# Patient Record
Sex: Male | Born: 1995 | Race: White | Hispanic: No | Marital: Married | Smoking: Never smoker
Health system: Southern US, Community
[De-identification: ages and names within clinical notes are randomized; demographics above are authoritative.]

## PROBLEM LIST (undated history)

## (undated) DIAGNOSIS — F419 Anxiety disorder, unspecified: Secondary | ICD-10-CM

## (undated) DIAGNOSIS — F32A Depression, unspecified: Secondary | ICD-10-CM

## (undated) DIAGNOSIS — Z789 Other specified health status: Secondary | ICD-10-CM

## (undated) HISTORY — PX: WISDOM TOOTH EXTRACTION: SHX21

---
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
## 2017-06-10 DIAGNOSIS — R519 Headache, unspecified: Secondary | ICD-10-CM | POA: Insufficient documentation

## 2017-08-19 ENCOUNTER — Ambulatory Visit: Payer: BLUE CROSS/BLUE SHIELD | Admitting: Podiatry

## 2017-08-19 ENCOUNTER — Encounter: Payer: Self-pay | Admitting: Podiatry

## 2017-08-19 ENCOUNTER — Ambulatory Visit (INDEPENDENT_AMBULATORY_CARE_PROVIDER_SITE_OTHER): Payer: BLUE CROSS/BLUE SHIELD

## 2017-08-19 VITALS — BP 117/67 | HR 57 | Resp 16

## 2017-08-19 DIAGNOSIS — M779 Enthesopathy, unspecified: Secondary | ICD-10-CM

## 2017-08-19 DIAGNOSIS — S93401A Sprain of unspecified ligament of right ankle, initial encounter: Secondary | ICD-10-CM

## 2017-08-19 NOTE — Progress Notes (Signed)
  Subjective:  Patient ID: Eric Strickland, male    DOB: [birth year],  MRN: [id number] HPI Chief Complaint  Patient presents with  . Foot Pain    Lateral foot and ankle right - rolled ankle playing dodgeball 2 years ago, has pain intermittently since injury, pain has now been more constant, pain AM and at end of day is worse, tried brace-no help  . New Patient (Initial Visit)    22 y.o. male presents with the above complaint.  He states that immediately after the injury from playing dodgeball 2-1/2 years ago he went to the orthopedic surgeon who put him in a brace.  The brace failed to improve his ability to ambulate without pain and was then sent to physical therapy for period of 6 weeks.  He states that it has lessened the initial symptoms but he still has pain every day on his job with ambulation.  He says it is affecting his ability to perform his daily activities. ROS: Denies fever chills nausea vomiting muscle aches pains calf pain back pain chest pain shortness of breath and headache.  No past medical history on file. No past surgical history on file.  Current Outpatient Medications:  .  nortriptyline (PAMELOR) 10 MG capsule, Take 10 mg by mouth at bedtime., Disp: , Rfl:   No Known Allergies Review of Systems Objective:   Vitals:   08/19/17 1556  BP: 117/67  Pulse: (!) 57  Resp: 16    General: Well developed, nourished, in no acute distress, alert and oriented x3   Dermatological: Skin is warm, dry and supple bilateral. Nails x 10 are well maintained; remaining integument appears unremarkable at this time. There are no open sores, no preulcerative lesions, no rash or signs of infection present.  Vascular: Dorsalis Pedis artery and Posterior Tibial artery pedal pulses are 2/4 bilateral with immedate capillary fill time. Pedal hair growth present. No varicosities and no lower extremity edema present bilateral.   Neruologic: Grossly intact via light touch bilateral. Vibratory  intact via tuning fork bilateral. Protective threshold with Semmes Wienstein monofilament intact to all pedal sites bilateral. Patellar and Achilles deep tendon reflexes 2+ bilateral. No Babinski or clonus noted bilateral.   Musculoskeletal: No gross boney pedal deformities bilateral. No pain, crepitus, or limitation noted with foot and ankle range of motion bilateral. Muscular strength 5/5 in all groups tested bilateral.  He has pain on palpation of the anterior talofibular ligament of the sinus tarsi and pain on end range of motion of the subtalar joint.  The mortise appears to be stable.  Mild tenderness on palpation of the calcaneofibular ligament.  Gait: Unassisted, Nonantalgic.    Radiographs:  Radiographs taken today do not demonstrate any major osseous abnormalities.  The subtalar joint appears to be slightly sclerotic possibly an injury within the subtalar joint itself has gone on to heal.  There is some joint space narrowing in this area and a small spur to the dorsal neck of the talus.  The mortise itself appears to be normal without any depressions.  Assessment & Plan:   Assessment: Moderate to severe ankle pain cannot rule out a tear of the anterior talofibular ligament or a subtalar joint injury.  Plan: At this point I started him on anti-inflammatory meloxicam 15 mg 1 p.o. daily and requesting an MRI for evaluation of a possible tear of the anterior talofibular ligament lateral ankle instability and subtalar joint pain.     Mayara Paulson T. PrestonHyatt, [place] Dakota[place]

## 2017-08-20 NOTE — Addendum Note (Signed)
Addended by: Geraldine ContrasVENABLE, Yuvraj Pfeifer D on: 08/20/2017 11:32 AM   Modules accepted: Orders

## 2017-08-28 ENCOUNTER — Telehealth: Payer: Self-pay | Admitting: *Deleted

## 2017-08-28 NOTE — Telephone Encounter (Signed)
"  Dr. Al CorpusHyatt has requested an appeal for a lower extremity MRI of right ankle for [facility].  I was calling to let you know we received the request.  We are re-opening the case today.  For the appeals process, we are requesting supporting clinical documentation to be faxed to us by Tuesday, June 18.  Our appeals fax number is [phone] and be sure to put attention appeals on your cover sheet.  If no additional information is sent, we will make a decision based on information already provided in the case.  If there are any questions, I can be reached at [phone] ext. [phone].  Things such as notes, images, and a letter of medical necessity (optional)  can be sent for appeals.

## 2017-08-31 ENCOUNTER — Telehealth: Payer: Self-pay | Admitting: *Deleted

## 2017-08-31 NOTE — Telephone Encounter (Signed)
Pt's grandmtr, states she received notice pt's insurance would not pay for the MRI, please call.

## 2017-08-31 NOTE — Telephone Encounter (Signed)
Grandmother was notified that we are waiting on peer to peer and once approved I will call her back and let her know.   She verbalized understanding.

## 2017-08-31 NOTE — Telephone Encounter (Signed)
Office note has been faxed to appeals for review.

## 2017-09-01 ENCOUNTER — Telehealth: Payer: Self-pay | Admitting: *Deleted

## 2017-09-01 NOTE — Telephone Encounter (Signed)
MRI has been approved   Auth # [id number]    Patient has been notified of approval and has MRI scheduled for 09/08/17 @5pm 

## 2017-09-01 NOTE — Telephone Encounter (Signed)
done

## 2017-09-01 NOTE — Telephone Encounter (Signed)
"  It's in regards to a provider's courtesy review that was done for this [employer] member, Eric Strickland.  Dr. Ernestene KielMax Hyatt is ordering a MRI of Ankle to be done at [facility].  The denial has been over turned.  Please note that receiving this is not a guarantee of payment.  Payment is subject to the member's active enrollment, benefits and limitations and other terms of the member's contract at the time services were provided.  The number for the MRI right ankle is [id number], it is valid for 30 days, June 17 through July 16.  That information will also be sent to the office.  If you have any questions, give me a call."

## 2017-09-10 ENCOUNTER — Ambulatory Visit
Admission: RE | Admit: 2017-09-10 | Discharge: 2017-09-10 | Disposition: A | Discharge status: Home / Self Care | Payer: BLUE CROSS/BLUE SHIELD | Source: Ambulatory Visit | Attending: Podiatry | Admitting: Podiatry

## 2017-09-10 DIAGNOSIS — M948X7 Other specified disorders of cartilage, ankle and foot: Secondary | ICD-10-CM | POA: Insufficient documentation

## 2017-09-10 DIAGNOSIS — S93401A Sprain of unspecified ligament of right ankle, initial encounter: Secondary | ICD-10-CM | POA: Diagnosis present

## 2017-09-22 ENCOUNTER — Telehealth: Payer: Self-pay | Admitting: *Deleted

## 2017-09-22 NOTE — Telephone Encounter (Signed)
-----   Message from Elinor ParkinsonMax T Hyatt, [place] sent at 09/16/2017  6:40 AM EDT ----- Sent for an over read and inform patient of the delay.  This will more than likely go to Dr. Logan BoresEvans.

## 2017-09-22 NOTE — Telephone Encounter (Signed)
I informed pt of Dr. Geryl RankinsHyatt's review of results and request to send copy of MRI disc to radiology specialist for more details for treatment planning. [facility].

## 2017-09-25 NOTE — Telephone Encounter (Signed)
Mailed copy of MRI disc to [facility]. 

## 2017-10-02 ENCOUNTER — Encounter: Payer: Self-pay | Admitting: Podiatry

## 2017-10-07 ENCOUNTER — Ambulatory Visit: Payer: BLUE CROSS/BLUE SHIELD | Admitting: Podiatry

## 2017-10-08 ENCOUNTER — Telehealth: Payer: Self-pay | Admitting: *Deleted

## 2017-10-08 NOTE — Telephone Encounter (Signed)
I was told that you guys have my MRI results. If you could please give me a call back at [phone]. Thank you.

## 2017-10-08 NOTE — Telephone Encounter (Signed)
Dr. Al CorpusHyatt reviewed the MRI over read and ordered for pt to be evaluated by Dr. Logan BoresEvans. Unable to leave a message voicemail box has not been set up on mobile phone. Left message on pt's home phone to call to discuss results of MRI.

## 2017-10-08 NOTE — Telephone Encounter (Signed)
I informed pt of Dr. Geryl RankinsHyatt's review of results and orders. Pt states understanding and I transferred to scheduler to be scheduled with Dr. Logan BoresEvans.

## 2017-10-19 ENCOUNTER — Ambulatory Visit: Payer: BLUE CROSS/BLUE SHIELD | Admitting: Podiatry

## 2017-10-23 ENCOUNTER — Encounter: Payer: Self-pay | Admitting: Podiatry

## 2017-10-23 ENCOUNTER — Ambulatory Visit: Payer: BLUE CROSS/BLUE SHIELD | Admitting: Podiatry

## 2017-10-23 DIAGNOSIS — M7671 Peroneal tendinitis, right leg: Secondary | ICD-10-CM

## 2017-10-23 MED ORDER — MELOXICAM 15 MG PO TABS: 15.0000 mg | ORAL_TABLET | Freq: Every day | ORAL | 1 refills | Status: AC

## 2017-10-23 MED ORDER — METHYLPREDNISOLONE 4 MG PO TBPK: ORAL_TABLET | ORAL | 0 refills | Status: DC

## 2017-10-26 NOTE — Progress Notes (Signed)
   HPI: 22 year old male presenting today for follow up evaluation of right foot pain. He states he has been feeling better overall but reports significant pain today. Walking and bearing weight increases the pain. Patient is here for further evaluation and treatment.   No past medical history on file.   Physical Exam: General: The patient is alert and oriented x3 in no acute distress.  Dermatology: Skin is warm, dry and supple bilateral lower extremities. Negative for open lesions or macerations.  Vascular: Palpable pedal pulses bilaterally. No edema or erythema noted. Capillary refill within normal limits.  Neurological: Epicritic and protective threshold grossly intact bilaterally.   Musculoskeletal Exam: Pain with palpation to the peroneal tendon of the right foot. Range of motion within normal limits to all pedal and ankle joints bilateral. Muscle strength 5/5 in all groups bilateral.   MRI Impression:  1. 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 2. High-grade partial-thickness cartilage loss of lateral aspect of the tibial plafond.  Assessment: 1. Peroneal tendinitis with partial tear right    Plan of Care:  1. Patient evaluated. MRI reviewed.  2. Injection of 0.5 mLs Celestone Soluspan injected into the peroneal tendon sheath of the right foot.  3. Prescription for Medrol Dose Pak provided to patient.  4. Prescription for Meloxicam provided to patient.  5. Return to clinic in 4 weeks.   Management consultantCommercial electrician.      Felecia ShellingBrent M. Evans, DPM [facility]  Dr. Felecia ShellingBrent M. Evans, DPM    [address]                                        [place], [place] [address]                Office [phone]  Fax [phone]

## 2017-11-27 ENCOUNTER — Ambulatory Visit: Payer: BLUE CROSS/BLUE SHIELD | Admitting: Podiatry

## 2017-12-15 ENCOUNTER — Ambulatory Visit: Payer: BLUE CROSS/BLUE SHIELD | Admitting: Podiatry

## 2018-01-12 ENCOUNTER — Encounter: Payer: Self-pay | Admitting: Podiatry

## 2018-01-12 ENCOUNTER — Ambulatory Visit: Payer: BLUE CROSS/BLUE SHIELD | Admitting: Podiatry

## 2018-01-12 DIAGNOSIS — M7671 Peroneal tendinitis, right leg: Secondary | ICD-10-CM | POA: Diagnosis not present

## 2018-01-12 MED ORDER — MELOXICAM 15 MG PO TABS: 15.0000 mg | ORAL_TABLET | Freq: Every day | ORAL | 1 refills | Status: AC

## 2018-01-13 NOTE — Progress Notes (Signed)
   HPI: 22 year old male presenting today for follow up evaluation of right foot pain. He states his pain has improved significantly. He denies daily pain but reports some intermittent mild discomfort. He denies any new complaints or modifying factors. Patient is here for further evaluation and treatment.   History reviewed. No pertinent past medical history.   Physical Exam: General: The patient is alert and oriented x3 in no acute distress.  Dermatology: Skin is warm, dry and supple bilateral lower extremities. Negative for open lesions or macerations.  Vascular: Palpable pedal pulses bilaterally. No edema or erythema noted. Capillary refill within normal limits.  Neurological: Epicritic and protective threshold grossly intact bilaterally.   Musculoskeletal Exam: Pain with palpation to the peroneal tendon of the right foot. Range of motion within normal limits to all pedal and ankle joints bilateral. Muscle strength 5/5 in all groups bilateral.   Assessment: 1. Peroneal tendinitis right    Plan of Care:  1. Patient evaluated. 2. Injection of 0.5 mLs Celestone Soluspan injected into the peroneal tendon sheath of the right foot.  3. Prescription for Meloxicam provided to patient.  4. Return to clinic as needed.   Management consultant.      Felecia Shelling, DPM [facility]  Dr. Felecia Shelling, DPM    [address] [place], [place] [address]                Office [phone]  Fax [phone]

## 2018-08-16 ENCOUNTER — Other Ambulatory Visit: Payer: Self-pay

## 2018-08-16 ENCOUNTER — Encounter: Payer: Self-pay | Admitting: *Deleted

## 2018-08-19 ENCOUNTER — Other Ambulatory Visit
Admission: RE | Admit: 2018-08-19 | Discharge: 2018-08-19 | Disposition: A | Discharge status: Home / Self Care | Payer: BLUE CROSS/BLUE SHIELD | Source: Ambulatory Visit | Attending: Unknown Physician Specialty | Admitting: Unknown Physician Specialty

## 2018-08-19 ENCOUNTER — Other Ambulatory Visit: Payer: Self-pay

## 2018-08-19 DIAGNOSIS — H65493 Other chronic nonsuppurative otitis media, bilateral: Secondary | ICD-10-CM | POA: Diagnosis not present

## 2018-08-19 DIAGNOSIS — E669 Obesity, unspecified: Secondary | ICD-10-CM | POA: Diagnosis not present

## 2018-08-19 DIAGNOSIS — J329 Chronic sinusitis, unspecified: Secondary | ICD-10-CM | POA: Diagnosis not present

## 2018-08-19 DIAGNOSIS — J343 Hypertrophy of nasal turbinates: Secondary | ICD-10-CM | POA: Diagnosis not present

## 2018-08-19 DIAGNOSIS — Z1159 Encounter for screening for other viral diseases: Secondary | ICD-10-CM | POA: Diagnosis not present

## 2018-08-19 DIAGNOSIS — Z6834 Body mass index (BMI) 34.0-34.9, adult: Secondary | ICD-10-CM | POA: Diagnosis not present

## 2018-08-20 ENCOUNTER — Ambulatory Visit
Admission: RE | Admit: 2018-08-20 | Payer: BLUE CROSS/BLUE SHIELD | Source: Home / Self Care | Admitting: Unknown Physician Specialty

## 2018-08-20 ENCOUNTER — Other Ambulatory Visit
Admission: RE | Admit: 2018-08-20 | Discharge: 2018-08-20 | Disposition: A | Discharge status: Home / Self Care | Payer: BLUE CROSS/BLUE SHIELD | Source: Ambulatory Visit | Attending: Unknown Physician Specialty | Admitting: Unknown Physician Specialty

## 2018-08-20 ENCOUNTER — Ambulatory Visit
Admission: RE | Admit: 2018-08-20 | Discharge: 2018-08-20 | Disposition: A | Discharge status: Home / Self Care | Payer: BLUE CROSS/BLUE SHIELD | Source: Ambulatory Visit | Attending: Unknown Physician Specialty | Admitting: Unknown Physician Specialty

## 2018-08-20 ENCOUNTER — Ambulatory Visit: Admission: RE | Admit: 2018-08-20 | Payer: BLUE CROSS/BLUE SHIELD | Source: Ambulatory Visit

## 2018-08-20 ENCOUNTER — Ambulatory Visit: Payer: BLUE CROSS/BLUE SHIELD | Admitting: Certified Registered"

## 2018-08-20 ENCOUNTER — Encounter
Admission: RE | Disposition: A | Discharge status: Home / Self Care | Payer: Self-pay | Source: Home / Self Care | Attending: Unknown Physician Specialty

## 2018-08-20 ENCOUNTER — Ambulatory Visit
Admission: RE | Admit: 2018-08-20 | Discharge: 2018-08-20 | Disposition: A | Discharge status: Home / Self Care | Payer: BLUE CROSS/BLUE SHIELD | Attending: Unknown Physician Specialty | Admitting: Unknown Physician Specialty

## 2018-08-20 ENCOUNTER — Encounter: Admission: RE | Payer: Self-pay | Source: Home / Self Care

## 2018-08-20 ENCOUNTER — Ambulatory Visit: Payer: BLUE CROSS/BLUE SHIELD | Admitting: Anesthesiology

## 2018-08-20 ENCOUNTER — Other Ambulatory Visit: Payer: Self-pay | Admitting: Unknown Physician Specialty

## 2018-08-20 DIAGNOSIS — M795 Residual foreign body in soft tissue: Secondary | ICD-10-CM

## 2018-08-20 DIAGNOSIS — J329 Chronic sinusitis, unspecified: Secondary | ICD-10-CM | POA: Insufficient documentation

## 2018-08-20 DIAGNOSIS — E669 Obesity, unspecified: Secondary | ICD-10-CM | POA: Insufficient documentation

## 2018-08-20 DIAGNOSIS — Z1159 Encounter for screening for other viral diseases: Secondary | ICD-10-CM | POA: Insufficient documentation

## 2018-08-20 DIAGNOSIS — H65493 Other chronic nonsuppurative otitis media, bilateral: Secondary | ICD-10-CM | POA: Insufficient documentation

## 2018-08-20 DIAGNOSIS — Z6834 Body mass index (BMI) 34.0-34.9, adult: Secondary | ICD-10-CM | POA: Insufficient documentation

## 2018-08-20 DIAGNOSIS — Z09 Encounter for follow-up examination after completed treatment for conditions other than malignant neoplasm: Secondary | ICD-10-CM

## 2018-08-20 DIAGNOSIS — J343 Hypertrophy of nasal turbinates: Secondary | ICD-10-CM | POA: Insufficient documentation

## 2018-08-20 HISTORY — PX: MYRINGOTOMY WITH TUBE PLACEMENT: SHX5663

## 2018-08-20 HISTORY — PX: ETHMOIDECTOMY: SHX5197

## 2018-08-20 HISTORY — PX: FRONTAL SINUS EXPLORATION: SHX6591

## 2018-08-20 HISTORY — PX: IMAGE GUIDED SINUS SURGERY: SHX6570

## 2018-08-20 HISTORY — PX: NASAL TURBINATE REDUCTION: SHX2072

## 2018-08-20 HISTORY — PX: NASAL ENDOSCOPY WITH EPISTAXIS CONTROL: SHX5664

## 2018-08-20 HISTORY — PX: MAXILLARY ANTROSTOMY: SHX2003

## 2018-08-20 HISTORY — DX: Other specified health status: Z78.9

## 2018-08-20 LAB — SARS CORONAVIRUS 2 BY RT PCR (HOSPITAL ORDER, PERFORMED IN ~~LOC~~ HOSPITAL LAB): SARS Coronavirus 2: NEGATIVE

## 2018-08-20 LAB — NOVEL CORONAVIRUS, NAA (HOSP ORDER, SEND-OUT TO REF LAB; TAT 18-24 HRS): SARS-CoV-2, NAA: NOT DETECTED

## 2018-08-20 SURGERY — SINUS SURGERY, WITH IMAGING GUIDANCE
Anesthesia: General

## 2018-08-20 SURGERY — CONTROL OF EPISTAXIS, ENDOSCOPIC
Anesthesia: General | Site: Nose

## 2018-08-20 SURGERY — SINUS SURGERY, WITH IMAGING GUIDANCE
Anesthesia: General | Site: Nose

## 2018-08-20 MED ORDER — OXYCODONE HCL 5 MG/5ML PO SOLN
5.0000 mg | Freq: Once | ORAL | Status: DC | PRN
  Administered 2018-08-20: 5 mg via ORAL

## 2018-08-20 MED ORDER — CLINDAMYCIN HCL 300 MG PO CAPS: 300.0000 mg | ORAL_CAPSULE | Freq: Three times a day (TID) | ORAL | 0 refills | Status: DC

## 2018-08-20 MED ORDER — LIDOCAINE HCL (CARDIAC) PF 100 MG/5ML IV SOSY
PREFILLED_SYRINGE | INTRAVENOUS | Status: DC | PRN
  Administered 2018-08-20: 40 mg via INTRAVENOUS

## 2018-08-20 MED ORDER — FENTANYL CITRATE (PF) 100 MCG/2ML IJ SOLN
25.0000 ug | INTRAMUSCULAR | Status: DC | PRN
  Administered 2018-08-20: 50 ug via INTRAVENOUS
  Administered 2018-08-20 (×2): 25 ug via INTRAVENOUS

## 2018-08-20 MED ORDER — GLYCOPYRROLATE 0.2 MG/ML IJ SOLN
INTRAMUSCULAR | Status: DC | PRN
  Administered 2018-08-20: 0.1 mg via INTRAVENOUS

## 2018-08-20 MED ORDER — PROPOFOL 10 MG/ML IV BOLUS
INTRAVENOUS | Status: DC | PRN
  Administered 2018-08-20: 200 mg via INTRAVENOUS

## 2018-08-20 MED ORDER — SUCCINYLCHOLINE CHLORIDE 20 MG/ML IJ SOLN
INTRAMUSCULAR | Status: DC | PRN
  Administered 2018-08-20: 100 mg via INTRAVENOUS

## 2018-08-20 MED ORDER — ACETAMINOPHEN 10 MG/ML IV SOLN
1000.0000 mg | Freq: Once | INTRAVENOUS | Status: AC
  Administered 2018-08-20: 1000 mg via INTRAVENOUS

## 2018-08-20 MED ORDER — DEXMEDETOMIDINE HCL 200 MCG/2ML IV SOLN
INTRAVENOUS | Status: DC | PRN
  Administered 2018-08-20 (×2): 10 ug via INTRAVENOUS
  Administered 2018-08-20: 5 ug via INTRAVENOUS

## 2018-08-20 MED ORDER — OXYCODONE HCL 5 MG PO TABS: 5.0000 mg | ORAL_TABLET | Freq: Once | ORAL | Status: DC | PRN

## 2018-08-20 MED ORDER — ACETAMINOPHEN 325 MG PO TABS: 325.0000 mg | ORAL_TABLET | ORAL | Status: DC | PRN

## 2018-08-20 MED ORDER — DEXAMETHASONE SODIUM PHOSPHATE 4 MG/ML IJ SOLN
INTRAMUSCULAR | Status: DC | PRN
  Administered 2018-08-20: 4 mg via INTRAVENOUS

## 2018-08-20 MED ORDER — ONDANSETRON HCL 4 MG/2ML IJ SOLN
INTRAMUSCULAR | Status: DC | PRN
  Administered 2018-08-20: 4 mg via INTRAVENOUS

## 2018-08-20 MED ORDER — PHENYLEPHRINE HCL 0.5 % NA SOLN
NASAL | Status: DC | PRN
  Administered 2018-08-20: 15 mL via TOPICAL

## 2018-08-20 MED ORDER — PROMETHAZINE HCL 25 MG/ML IJ SOLN: 6.2500 mg | INTRAMUSCULAR | Status: DC | PRN

## 2018-08-20 MED ORDER — LACTATED RINGERS IV SOLN
10.0000 mL/h | INTRAVENOUS | Status: DC
  Administered 2018-08-20: 10 mL/h via INTRAVENOUS

## 2018-08-20 MED ORDER — OXYCODONE-ACETAMINOPHEN 10-325 MG PO TABS: 1.0000 | ORAL_TABLET | Freq: Four times a day (QID) | ORAL | 0 refills | Status: AC | PRN

## 2018-08-20 MED ORDER — OXYMETAZOLINE HCL 0.05 % NA SOLN
6.0000 | Freq: Once | NASAL | Status: AC
  Administered 2018-08-20: 6 via NASAL

## 2018-08-20 MED ORDER — MIDAZOLAM HCL 5 MG/5ML IJ SOLN
INTRAMUSCULAR | Status: DC | PRN
  Administered 2018-08-20: 2 mg via INTRAVENOUS

## 2018-08-20 MED ORDER — CIPROFLOXACIN-DEXAMETHASONE 0.3-0.1 % OT SUSP
OTIC | Status: DC | PRN
  Administered 2018-08-20: 1 [drp] via OTIC

## 2018-08-20 MED ORDER — DEXAMETHASONE SODIUM PHOSPHATE 4 MG/ML IJ SOLN
INTRAMUSCULAR | Status: DC | PRN
  Administered 2018-08-20: 10 mg via INTRAVENOUS

## 2018-08-20 MED ORDER — LIDOCAINE-EPINEPHRINE 1 %-1:100000 IJ SOLN
INTRAMUSCULAR | Status: DC | PRN
  Administered 2018-08-20: 6 mL

## 2018-08-20 MED ORDER — ACETAMINOPHEN 160 MG/5ML PO SOLN: 325.0000 mg | ORAL | Status: DC | PRN

## 2018-08-20 MED ORDER — FENTANYL CITRATE (PF) 100 MCG/2ML IJ SOLN
INTRAMUSCULAR | Status: DC | PRN
  Administered 2018-08-20: 25 ug via INTRAVENOUS
  Administered 2018-08-20: 100 ug via INTRAVENOUS
  Administered 2018-08-20: 25 ug via INTRAVENOUS

## 2018-08-20 MED ORDER — LIDOCAINE HCL 4 % MT SOLN
OROMUCOSAL | Status: DC | PRN
  Administered 2018-08-20: 4 mL via TOPICAL

## 2018-08-20 SURGICAL SUPPLY — 37 items
BATTERY INSTRU NAVIGATION (MISCELLANEOUS) ×15 IMPLANT
BLADE MYR LANCE NRW W/HDL (BLADE) ×5 IMPLANT
CANISTER SUCT 1200ML W/VALVE (MISCELLANEOUS) ×5 IMPLANT
COAG SUCT 10F 3.5MM HAND CTRL (MISCELLANEOUS) ×5 IMPLANT
COTTONBALL LRG STERILE PKG (GAUZE/BANDAGES/DRESSINGS) ×5 IMPLANT
CUP MEDICINE 2OZ PLAST GRAD ST (MISCELLANEOUS) ×5 IMPLANT
DRAPE HEAD BAR (DRAPES) ×5 IMPLANT
DRESSING NASL FOAM PST OP SINU (MISCELLANEOUS) ×6 IMPLANT
DRSG NASAL FOAM POST OP SINU (MISCELLANEOUS) ×10
ELECT REM PT RETURN 9FT ADLT (ELECTROSURGICAL) ×5
ELECTRODE REM PT RTRN 9FT ADLT (ELECTROSURGICAL) ×3 IMPLANT
GLOVE BIO SURGEON STRL SZ7.5 (GLOVE) ×10 IMPLANT
HANDLE YANKAUER SUCT BULB TIP (MISCELLANEOUS) ×5 IMPLANT
KIT TURNOVER KIT A (KITS) ×5 IMPLANT
NDL HYPO 25GX1X1/2 BEV (NEEDLE) ×3 IMPLANT
NEEDLE HYPO 25GX1X1/2 BEV (NEEDLE) ×5 IMPLANT
NS IRRIG 500ML POUR BTL (IV SOLUTION) ×5 IMPLANT
PACK ENT CUSTOM (PACKS) ×5 IMPLANT
SOL ANTI-FOG 6CC FOG-OUT (MISCELLANEOUS) ×3 IMPLANT
SOL FOG-OUT ANTI-FOG 6CC (MISCELLANEOUS) ×2
SPONGE NEURO XRAY DETECT 1X3 (DISPOSABLE) ×5 IMPLANT
STRAP BODY AND KNEE 60X3 (MISCELLANEOUS) ×5 IMPLANT
SUT CHROMIC 3-0 (SUTURE) ×2
SUT CHROMIC 3-0 KS 27XMFL CR (SUTURE) ×3
SUT ETHILON 3-0 KS 30 BLK (SUTURE) ×5 IMPLANT
SUTURE CHRMC 3-0 KS 27XMFL CR (SUTURE) ×3 IMPLANT
SYR 10ML LL (SYRINGE) ×5 IMPLANT
TOWEL OR 17X26 4PK STRL BLUE (TOWEL DISPOSABLE) ×5 IMPLANT
TRACKER CRANIALMASK (MASK) ×5 IMPLANT
TRAP MUCOUS 40ML (MISCELLANEOUS) ×5 IMPLANT
TUBE EAR ARMSTRONG HC 1.14X3.5 (OTOLOGIC RELATED) ×10 IMPLANT
TUBING CONN 6MMX3.1M (TUBING) ×2
TUBING CONNECTING 10 (TUBING) ×4 IMPLANT
TUBING CONNECTING 10' (TUBING) ×1
TUBING SUCTION CONN 0.25 STRL (TUBING) ×3 IMPLANT
WATER STERILE IRR 250ML POUR (IV SOLUTION) ×5 IMPLANT
WATER STERILE IRR 500ML POUR (IV SOLUTION) ×5 IMPLANT

## 2018-08-20 SURGICAL SUPPLY — 16 items
CANISTER SUCT 1200ML W/VALVE (MISCELLANEOUS) ×3 IMPLANT
COAG SUCT 10F 3.5MM HAND CTRL (MISCELLANEOUS) ×2 IMPLANT
COTTONBALL LRG STERILE PKG (GAUZE/BANDAGES/DRESSINGS) IMPLANT
ELECT REM PT RETURN 9FT ADLT (ELECTROSURGICAL) ×3
ELECTRODE REM PT RTRN 9FT ADLT (ELECTROSURGICAL) IMPLANT
GLOVE BIO SURGEON STRL SZ7.5 (GLOVE) ×3 IMPLANT
HANDLE YANKAUER SUCT BULB TIP (MISCELLANEOUS) ×2 IMPLANT
PACK ENT CUSTOM (PACKS) ×2 IMPLANT
PACKING EPISTAXIS NASAL BLU LR (MISCELLANEOUS) ×4 IMPLANT
SPONGE NEURO XRAY DETECT 1X3 (DISPOSABLE) ×2 IMPLANT
STRAP BODY AND KNEE 60X3 (MISCELLANEOUS) ×3 IMPLANT
TOWEL OR 17X26 4PK STRL BLUE (TOWEL DISPOSABLE) ×3 IMPLANT
TUBING CONN 6MMX3.1M (TUBING) ×2
TUBING CONNECTING 10 (TUBING) ×1 IMPLANT
TUBING CONNECTING 10' (TUBING) ×1
TUBING SUCTION CONN 0.25 STRL (TUBING) ×1 IMPLANT

## 2018-08-20 NOTE — Op Note (Signed)
08/20/2018  11:04 AM    Eric Strickland, Eric Strickland  [id number]   Pre-Op Dx: Chronic Sinusitis  Hypertrophy of nasal turbinates Chronic Otitis Media  Post-op Dx: SAME  Proc: #1 Stryker navigation system used #2 bilateral endoscopic anterior and posterior ethmoid ectomy with removal of tissue #3 bilateral endoscopic maxillary antrostomy with removal of tissue #4 bilateral endoscopic frontal sinusotomy with removal of tissue #5 bilateral myringotomy and tube placement #6 bilateral submucous resection of the inferior turbinates  Surg:  Eric Strickland  Anes:  GOT  EBL: Approximately 30 cc  Comp: None  Findings: Bilateral middle ear effusions 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  After general endotracheal anesthesia the operating microscope was brought in the field.  Beginning on the right-hand side the ear was cleaned of cerumen.  Inferior myringotomy was performed there was mucoid liquid in the middle ear space which was suctioned free.  Armstrong grommet was placed followed by Ciprodex.  In similar fashion the left ear was examined an inferior myringotomy was performed there was mucoid effusion in the middle ear space which again was suctioned free and Armstrong grommet was placed followed by Ciprodex.  This completed the table was turned 90 degrees the Stryker navigation device was placed calibrated and remained on throughout the procedure.  The patient was then draped in usual fashion for endoscopic sinus surgery.  Cottonoid pledgets with phenylephrine lidocaine solution were placed within each nostril.  After approximate 5 minutes these were removed.  Local anesthetic of 1% lidocaine with 1 100,000 units of epinephrine was used to inject the inferior turbinates the middle turbinates and the lateral nasal walls bilaterally.  A  total of 7 cc was used.  The 0 degree endoscope was then brought into the field getting on the left-hand side the nasal passage was examined.  The middle turbinate was gently medialized there was mucopurulent debris which was seen coming from the ethmoid sinus this was sent for culture.  Using the Stryker navigation and visualization the uncinate process on the left was identified.  The caudal elevator was then used to take down the uncinate process which was removed using straight and side-biting forceps.  Maxillary sinus was opened there was polypoid mucosa as well as pus emanating from the sinus.  This pus was sent for culture.  The polypoid disease was removed and the antrostomy was opened widely.  Ethmoid bulla was then identified the anterior and posterior ethmoid air cells were then entered using the Stryker navigation suction and the straight and 45 degree forceps were then used to clean the ethmoid cavity.  Frontal sinus was then traced upward and entered using the 45 degree forceps.  Again there was polypoid mucosa throughout.  The left sinus was cleaned and  a cottonoid pledget was placed in the left and right side was examined.  The middle turbinate was gently medialized the uncinate process was identified and taken down using the caudal elevator.  A curved suction was then placed into the sinus there was polypoid disease throughout the sinus which was also removed.  Straight and side-biting forceps were then used to open this antrostomy widely.  Ethmoid bulla on the right was identified and using the Stryker navigator the anterior and posterior ethmoid air cells were probed.  There was thickened polypoid disease throughout which was removed using the straight 45 degree  forceps.  There is actually mucopurulence draining from the frontal sinus this was traced superiorly up into the sinus and the opening was cleared and opened widely.  There were several small bony fragments in the ethmoid cavity which  were gently removed.  Cottonoid pledget was placed on the right the left side was reexamined pledget removed any small bony fragments are polypoid disease in the ethmoid and maxillary frontal cavities were removed.  Similar fashion the pledget was removed on the right small bony fragments in the ethmoid cavity were removed the ethmoid maxillary frontal sinuses were patent.  Operation then turned the submucous section inferior turbinates.  Beginning on the left-hand side a 15 blade was used to incise along the inferior edge of the inferior turbinate down to the chondral bone and superiorly laterally based flap was then elevated.  Night scissors were then used to excise the uncinate bone and underlying mucosa.  The superior laterally based flap was then laid back over the chondral stump this was cauterized using suction cautery for hemostasis.  Similar fashion submucous resection was performed of the inferior turbinate on the right.  With completion of the SMR of the turbinates reinspection with the endoscope showed no evidence of significant bleeding.  Stambarger gel was then placed in both ethmoid cavities as well as bullae thick beneath each inferior turbinate.  The patient was then returned to anesthesia where he was extubated in the operating room taken care of him in stable condition.  Specimen: Sinus contents  Cultures: Left ethmoid and left maxillary sinuses    Dispo:   Good  Plan: Charged home follow-up 1 week  Eric Strickland  08/20/2018 11:04 AM

## 2018-08-20 NOTE — Anesthesia Postprocedure Evaluation (Addendum)
Anesthesia Post Note  Patient: Eric Strickland  Procedure(s) Performed: IMAGE GUIDED SINUS SURGERY (N/A Nose) MAXILLARY ANTROSTOMY (Bilateral Nose) ETHMOIDECTOMY (Bilateral Nose) FRONTAL SINUS EXPLORATION (Bilateral Nose) TURBINATE REDUCTION/SUBMUCOSAL RESECTION (Bilateral Nose) MYRINGOTOMY WITH TUBE PLACEMENT (Bilateral Ear)  Patient location during evaluation: PACU Anesthesia Type: General Level of consciousness: awake Pain management: pain level controlled Vital Signs Assessment: post-procedure vital signs reviewed and stable Respiratory status: respiratory function stable Cardiovascular status: stable Postop Assessment: no signs of nausea or vomiting Anesthetic complications: no Comments: Taking patient back to OR emergently for persistent bleeding.    Jola Babinski

## 2018-08-20 NOTE — Anesthesia Postprocedure Evaluation (Signed)
Anesthesia Post Note  Patient: Eric Strickland  Procedure(s) Performed: NASAL ENDOSCOPY WITH EPISTAXIS CONTROL (N/A Nose)  Patient location during evaluation: PACU Anesthesia Type: General Level of consciousness: awake Pain management: pain level controlled Vital Signs Assessment: post-procedure vital signs reviewed and stable Respiratory status: respiratory function stable Cardiovascular status: stable Postop Assessment: no signs of nausea or vomiting Anesthetic complications: no    Jola Babinski

## 2018-08-20 NOTE — Transfer of Care (Signed)
Immediate Anesthesia Transfer of Care Note  Patient: Eric Strickland  Procedure(s) Performed: NASAL ENDOSCOPY WITH EPISTAXIS CONTROL (N/A Nose)  Patient Location: PACU  Anesthesia Type: General  Level of Consciousness: awake, alert  and patient cooperative  Airway and Oxygen Therapy: Patient Spontanous Breathing and Patient connected to supplemental oxygen  Post-op Assessment: Post-op Vital signs reviewed, Patient's Cardiovascular Status Stable, Respiratory Function Stable, Patent Airway and No signs of Nausea or vomiting  Post-op Vital Signs: Reviewed and stable  Complications: No apparent anesthesia complications

## 2018-08-20 NOTE — Discharge Instructions (Signed)
[facility] °[facility] °ENDOSCOPIC SINUS SURGERY °[facility] ° °What is Functional Endoscopic Sinus Surgery? ° The Surgery involves making the natural openings of the sinuses larger by removing the bony partitions that separate the sinuses from the nasal cavity.  The natural sinus lining is preserved as much as possible to allow the sinuses to resume normal function after the surgery.  In some patients nasal polyps (excessively swollen lining of the sinuses) may be removed to relieve obstruction of the sinus openings.  The surgery is performed through the nose using lighted scopes, which eliminates the need for incisions on the face.  A septoplasty is a different procedure which is sometimes performed with sinus surgery.  It involves straightening the boy partition that separates the two sides of your nose.  A crooked or deviated septum may need repair if is obstructing the sinuses or nasal airflow.  Turbinate reduction is also often performed during sinus surgery.  The turbinates are bony proturberances from the side walls of the nose which swell and can obstruct the nose in patients with sinus and allergy problems.  Their size can be surgically reduced to help relieve nasal obstruction. ° °What Can Sinus Surgery Do For Me? ° Sinus surgery can reduce the frequency of sinus infections requiring antibiotic treatment.  This can provide improvement in nasal congestion, post-nasal drainage, facial pressure and nasal obstruction.  Surgery will NOT prevent you from ever having an infection again, so it usually only for patients who get infections 4 or more times yearly requiring antibiotics, or for infections that do not clear with antibiotics.  It will not cure nasal allergies, so patients with allergies may still require medication to treat their allergies after surgery. Surgery may improve headaches related to sinusitis, however, some people will continue to  require medication to control sinus headaches related to allergies.  Surgery will do nothing for other forms of headache (migraine, tension or cluster). ° °What Are the Risks of Endoscopic Sinus Surgery? ° Current techniques allow surgery to be performed safely with little risk, however, there are rare complications that patients should be aware of.  Because the sinuses are located around the eyes, there is risk of eye injury, including blindness, though again, this would be quite rare. This is usually a result of bleeding behind the eye during surgery, which puts the vision oat risk, though there are treatments to protect the vision and prevent permanent disrupted by surgery causing a leak of the spinal fluid that surrounds the brain.  More serious complications would include bleeding inside the brain cavity or damage to the brain.  Again, all of these complications are uncommon, and spinal fluid leaks can be safely managed surgically if they occur.  The most common complication of sinus surgery is bleeding from the nose, which may require packing or cauterization of the nose.  Continued sinus have polyps may experience recurrence of the polyps requiring revision surgery.  Alterations of sense of smell or injury to the tear ducts are also rare complications.  ° °What is the Surgery Like, and what is the Recovery? ° The Surgery usually takes a couple of hours to perform, and is usually performed under a general anesthetic (completely asleep).  Patients are usually discharged home after a couple of hours.  Sometimes during surgery it is necessary to pack the nose to control bleeding, and the packing is left in place for 24 - 48 hours, and removed by your surgeon.    If a septoplasty was performed during the procedure, there is often a splint placed which must be removed after 5-7 days.   °Discomfort: Pain is usually mild to moderate, and can be controlled by prescription pain medication or acetaminophen (Tylenol).   Aspirin, Ibuprofen (Advil, Motrin), or Naprosyn (Aleve) should be avoided, as they can cause increased bleeding.  Most patients feel sinus pressure like they have a bad head cold for several days.  Sleeping with your head elevated can help reduce swelling and facial pressure, as can ice packs over the face.  A humidifier may be helpful to keep the mucous and blood from drying in the nose.  ° °Diet: There are no specific diet restrictions, however, you should generally start with clear liquids and a light diet of bland foods because the anesthetic can cause some nausea.  Advance your diet depending on how your stomach feels.  Taking your pain medication with food will often help reduce stomach upset which pain medications can cause. ° °Nasal Saline Irrigation: It is important to remove blood clots and dried mucous from the nose as it is healing.  This is done by having you irrigate the nose at least 3 - 4 times daily with a salt water solution.  We recommend using NeilMed Sinus Rinse (available at the drug store).  Fill the squeeze bottle with the solution, bend over a sink, and insert the tip of the squeeze bottle into the nose ½ of an inch.  Point the tip of the squeeze bottle towards the inside corner of the eye on the same side your irrigating.  Squeeze the bottle and gently irrigate the nose.  If you bend forward as you do this, most of the fluid will flow back out of the nose, instead of down your throat.   The solution should be warm, near body temperature, when you irrigate.   Each time you irrigate, you should use a full squeeze bottle.  ° °Note that if you are instructed to use Nasal Steroid Sprays at any time after your surgery, irrigate with saline BEFORE using the steroid spray, so you do not wash it all out of the nose. °Another product, Nasal Saline Gel (such as AYR Nasal Saline Gel) can be applied in each nostril 3 - 4 times daily to moisture the nose and reduce scabbing or crusting. ° °Bleeding:   Bloody drainage from the nose can be expected for several days, and patients are instructed to irrigate their nose frequently with salt water to help remove mucous and blood clots.  The drainage may be dark red or brown, though some fresh blood may be seen intermittently, especially after irrigation.  Do not blow you nose, as bleeding may occur. If you must sneeze, keep your mouth open to allow air to escape through your mouth. ° °If heavy bleeding occurs: Irrigate the nose with saline to rinse out clots, then spray the nose 3 - 4 times with Afrin Nasal Decongestant Spray.  The spray will constrict the blood vessels to slow bleeding.  Pinch the lower half of your nose shut to apply pressure, and lay down with your head elevated.  Ice packs over the nose may help as well. If bleeding persists despite these measures, you should notify your doctor.  Do not use the Afrin routinely to control nasal congestion after surgery, as it can result in worsening congestion and may affect healing.  ° ° ° °Activity: Return to work varies among patients. Most patients will be   out of work at least 5 - 7 days to recover.  Patient may return to work after they are off of narcotic pain medication, and feeling well enough to perform the functions of their job.  Patients must avoid heavy lifting (over 10 pounds) or strenuous physical for 2 weeks after surgery, so your employer may need to assign you to light duty, or keep you out of work longer if light duty is not possible.  NOTE: you should not drive, operate dangerous machinery, do any mentally demanding tasks or make any important legal or financial decisions while on narcotic pain medication and recovering from the general anesthetic.    Call Your Doctor Immediately if You Have Any of the Following: 1. Bleeding that you cannot control with the above measures 2. Loss of vision, double vision, bulging of the eye or black eyes. 3. Fever over 101 degrees 4. Neck stiffness with  severe headache, fever, nausea and change in mental state. You are always encourage to call anytime with concerns, however, please call with requests for pain medication refills during office hours.  Office Endoscopy: During follow-up visits your doctor will remove any packing or splints that may have been placed and evaluate and clean your sinuses endoscopically.  Topical anesthetic will be used to make this as comfortable as possible, though you may want to take your pain medication prior to the visit.  How often this will need to be done varies from patient to patient.  After complete recovery from the surgery, you may need follow-up endoscopy from time to time, particularly if there is concern of recurrent infection or nasal polyps.  [facility] DISCHARGE INSTRUCTIONS FOR MYRINGOTOMY AND TUBE INSERTION  [facility] Vernie Murders, M.D. Davina Poke, M.D. Marion Downer, M.D. Bud Face, M.D.  Diet:   After surgery, the patient should take only liquids and foods as tolerated.  The patient may then have a regular diet after the effects of anesthesia have worn off, usually about four to six hours after surgery.  Activities:   The patient should rest until the effects of anesthesia have worn off.  After this, there are no restrictions on the normal daily activities.  Medications:   You will be given antibiotic drops to be used in the ears postoperatively.  It is recommended to use 4 drops 2 times a day for 4 days, then the drops should be saved for possible future use.  The tubes should not cause any discomfort to the patient, but if there is any question, Tylenol should be given according to the instructions for the age of the patient.  Other medications should be continued normally.  Precautions:   Should there be recurrent drainage after the tubes are placed, the drops should be used for approximately 3-4 days.  If it does not clear, you should  call the ENT office.  Earplugs:   Earplugs are only needed for those who are going to be submerged under water.  When taking a bath or shower and using a cup or showerhead to rinse hair, it is not necessary to wear earplugs.  These come in a variety of fashions, all of which can be obtained at our office.  However, if one is not able to come by the office, then silicone plugs can be found at most pharmacies.  It is not advised to stick anything in the ear that is not approved as an earplug.  Silly putty is not to be  used as an Engineer, civil (consulting).  Swimming is allowed in patients after ear tubes are inserted, however, they must wear earplugs if they are going to be submerged under water.  For those children who are going to be swimming a lot, it is recommended to use a fitted ear mold, which can be made by our audiologist.  If discharge is noticed from the ears, this most likely represents an ear infection.  We would recommend getting your eardrops and using them as indicated above.  If it does not clear, then you should call the ENT office.  For follow up, the patient should return to the ENT office three weeks postoperatively and then every six months as required by the doctor.   General Anesthesia, Adult, Care After This sheet gives you information about how to care for yourself after your procedure. Your health care provider may also give you more specific instructions. If you have problems or questions, contact your health care provider. What can I expect after the procedure? After the procedure, the following side effects are common:  Pain or discomfort at the IV site.  Nausea.  Vomiting.  Sore throat.  Trouble concentrating.  Feeling cold or chills.  Weak or tired.  Sleepiness and fatigue.  Soreness and body aches. These side effects can affect parts of the body that were not involved in surgery. Follow these instructions at home:  For at least 24 hours after the procedure:  Have a  responsible adult stay with you. It is important to have someone help care for you until you are awake and alert.  Rest as needed.  Do not: ? Participate in activities in which you could fall or become injured. ? Drive. ? Use heavy machinery. ? Drink alcohol. ? Take sleeping pills or medicines that cause drowsiness. ? Make important decisions or sign legal documents. ? Take care of children on your own. Eating and drinking  Follow any instructions from your health care provider about eating or drinking restrictions.  When you feel hungry, start by eating small amounts of foods that are soft and easy to digest (bland), such as toast. Gradually return to your regular diet.  Drink enough fluid to keep your urine pale yellow.  If you vomit, rehydrate by drinking water, juice, or clear broth. General instructions  If you have sleep apnea, surgery and certain medicines can increase your risk for breathing problems. Follow instructions from your health care provider about wearing your sleep device: ? Anytime you are sleeping, including during daytime naps. ? While taking prescription pain medicines, sleeping medicines, or medicines that make you drowsy.  Return to your normal activities as told by your health care provider. Ask your health care provider what activities are safe for you.  Take over-the-counter and prescription medicines only as told by your health care provider.  If you smoke, do not smoke without supervision.  Keep all follow-up visits as told by your health care provider. This is important. Contact a health care provider if:  You have nausea or vomiting that does not get better with medicine.  You cannot eat or drink without vomiting.  You have pain that does not get better with medicine.  You are unable to pass urine.  You develop a skin rash.  You have a fever.  You have redness around your IV site that gets worse. Get help right away if:  You have  difficulty breathing.  You have chest pain.  You have blood in your urine or  stool, or you vomit blood. Summary  After the procedure, it is common to have a sore throat or nausea. It is also common to feel tired.  Have a responsible adult stay with you for the first 24 hours after general anesthesia. It is important to have someone help care for you until you are awake and alert.  When you feel hungry, start by eating small amounts of foods that are soft and easy to digest (bland), such as toast. Gradually return to your regular diet.  Drink enough fluid to keep your urine pale yellow.  Return to your normal activities as told by your health care provider. Ask your health care provider what activities are safe for you. This information is not intended to replace advice given to you by your health care provider. Make sure you discuss any questions you have with your health care provider. Document Released: 06/09/2000 Document Revised: 10/17/2016 Document Reviewed: 10/17/2016 Elsevier Interactive Patient Education  2019 [employer]

## 2018-08-20 NOTE — H&P (Signed)
The patient's history has been reviewed, patient examined, no change in status, stable for surgery.  Questions were answered to the patients satisfaction.  

## 2018-08-20 NOTE — Anesthesia Procedure Notes (Signed)
Procedure Name: Intubation Date/Time: 08/20/2018 9:42 AM Performed by: Jimmy Picket, CRNA Pre-anesthesia Checklist: Patient identified, Emergency Drugs available, Suction available, Patient being monitored and Timeout performed Patient Re-evaluated:Patient Re-evaluated prior to induction Oxygen Delivery Method: Circle system utilized Preoxygenation: Pre-oxygenation with 100% oxygen Induction Type: IV induction Ventilation: Mask ventilation without difficulty Laryngoscope Size: Miller and 3 Grade View: Grade I Tube type: Oral Rae Tube size: 8.0 mm Number of attempts: 1 Placement Confirmation: ETT inserted through vocal cords under direct vision,  positive ETCO2 and breath sounds checked- equal and bilateral Tube secured with: Tape Dental Injury: Teeth and Oropharynx as per pre-operative assessment

## 2018-08-20 NOTE — Anesthesia Preprocedure Evaluation (Signed)
Anesthesia Evaluation  Patient identified by MRN, date of birth, ID band Patient awake    Reviewed: Allergy & Precautions, NPO status , Patient's Chart, lab work & pertinent test results  Airway Mallampati: II  TM Distance: >3 FB     Dental   Pulmonary neg pulmonary ROS,    breath sounds clear to auscultation       Cardiovascular negative cardio ROS   Rhythm:Regular Rate:Normal     Neuro/Psych negative neurological ROS     GI/Hepatic negative GI ROS,   Endo/Other  Obesity - BMI 34  Renal/GU      Musculoskeletal   Abdominal   Peds  Hematology   Anesthesia Other Findings Chronic sinusitis and otitis media  Reproductive/Obstetrics                             Anesthesia Physical Anesthesia Plan  ASA: II  Anesthesia Plan: General   Post-op Pain Management:    Induction: Intravenous  PONV Risk Score and Plan: 2 and Ondansetron and Dexamethasone  Airway Management Planned: Oral ETT  Additional Equipment:   Intra-op Plan:   Post-operative Plan:   Informed Consent: I have reviewed the patients History and Physical, chart, labs and discussed the procedure including the risks, benefits and alternatives for the proposed anesthesia with the patient or authorized representative who has indicated his/her understanding and acceptance.     Dental advisory given  Plan Discussed with: CRNA  Anesthesia Plan Comments:         Anesthesia Quick Evaluation

## 2018-08-20 NOTE — Anesthesia Preprocedure Evaluation (Signed)
Anesthesia Evaluation  Patient identified by MRN, date of birth, ID band Patient awake    Reviewed: Allergy & Precautions, NPO status , Patient's Chart, lab work & pertinent test results  Airway Mallampati: II  TM Distance: >3 FB     Dental   Pulmonary neg pulmonary ROS,    breath sounds clear to auscultation       Cardiovascular negative cardio ROS   Rhythm:Regular Rate:Normal     Neuro/Psych negative neurological ROS     GI/Hepatic negative GI ROS,   Endo/Other  Obesity - BMI 34  Renal/GU      Musculoskeletal   Abdominal   Peds  Hematology   Anesthesia Other Findings Chronic sinusitis and otitis media  Reproductive/Obstetrics                             Anesthesia Physical  Anesthesia Plan  ASA: II and emergent  Anesthesia Plan: General   Post-op Pain Management:    Induction: Intravenous  PONV Risk Score and Plan: 2 and Ondansetron and Dexamethasone  Airway Management Planned: Oral ETT  Additional Equipment:   Intra-op Plan:   Post-operative Plan:   Informed Consent: I have reviewed the patients History and Physical, chart, labs and discussed the procedure including the risks, benefits and alternatives for the proposed anesthesia with the patient or authorized representative who has indicated his/her understanding and acceptance.     Dental advisory given  Plan Discussed with: CRNA  Anesthesia Plan Comments: (Back to OR emergently for bleeding - mother aware. )        Anesthesia Quick Evaluation

## 2018-08-20 NOTE — Progress Notes (Addendum)
Per MD Juengel, patients facial xrays are clear and patient can be discharged. Per MD Juengel, no retained objects. Radiologist review pending.

## 2018-08-20 NOTE — Anesthesia Procedure Notes (Addendum)
Procedure Name: Intubation Date/Time: 08/20/2018 12:37 PM Performed by: Jimmy Picket, CRNA Pre-anesthesia Checklist: Patient identified, Emergency Drugs available, Suction available, Patient being monitored and Timeout performed Patient Re-evaluated:Patient Re-evaluated prior to induction Oxygen Delivery Method: Circle system utilized Preoxygenation: Pre-oxygenation with 100% oxygen Induction Type: IV induction, Rapid sequence and Cricoid Pressure applied Ventilation: Mask ventilation without difficulty Laryngoscope Size: Miller and 3 Grade View: Grade I Tube type: Oral Rae Tube size: 8.5 mm Number of attempts: 1 Airway Equipment and Method: Bougie stylet Placement Confirmation: ETT inserted through vocal cords under direct vision,  positive ETCO2 and breath sounds checked- equal and bilateral Tube secured with: Tape Dental Injury: Teeth and Oropharynx as per pre-operative assessment

## 2018-08-20 NOTE — Progress Notes (Signed)
MD Jenne Campus notified at 1151 that patient was bleeding heavily out of his nose. MD Jenne Campus gave verbal order to administer afrin to patient and call him back.  MD Jenne Campus notified at 1203 that bleeding was persistent. Stated he was on his way back and that patient would need to be taken to OR.  Mom notified of plan and verbalized understanding.

## 2018-08-20 NOTE — Op Note (Signed)
08/20/2018  1:05 PM    Leona Carry  [id number]   Pre-Op Dx: sinus bleeding, when waking up from the first procedure the patient was severely combative was taken to the recovery room initially seem to be recovering nicely but began to bleed from the nose.  It was felt that we need taken back to the operating room to discover the cause of the bleeding.  Discussed with mom she understands and is eager for [place] to proceed  Post-op Dx: SAME  Proc: Endoscopic control of nasal epistaxis  Surg:  Davina Poke  Anes:  GOT  EBL: 300 cc  Comp: 0  Findings: Obvious bleeding vessel posterior aspect inferior turbinate left  Procedure: And was taken from the recovery room back to the operating room.  He was reintubated.  The table is turned 90 degrees patient was draped in usual fashion for endoscopic sinus surgery.  Large clots within each nostril which were removed.  Cottonoid pledgets with phenylephrine lidocaine were placed within each nostril.  While the phenylephrine was working the endoscope was brought into the field.  The pledgets were first removed on the right-hand side inspection with a 0 degree endoscope showed no evidence of active bleeding just minimal ooze from the inferior turbinate there was no bleeding from the ethmoid cavity.  Pledgets were removed from the left-hand side there was obvious bleeding from this side.  The 0 degree endoscope was introduced into the nose and using the suction cautery to suction the blood there was obvious bleeder in the posterior aspect of the inferior turbinate.  This was cauterized using the suction cautery and immediately stop the bleeding.  The blood was suctioned free inspection of thel ethmoid cavity the inferior turbinate there was no further active bleeding.  Several cauterization moves were then re-performed in this area of the post/inf turbinate just to be sure there was no further bleeding indeed there was none.  Because of the way he woke up  before and blew all of the Stammberger gel out I felt that nasal packing would be most appropriate.  Therefore 2 Merocel sponges full-length were placed within each nostril tied anteriorly anterior to the columella to prevent aspiration.  Dispo:   Good  Plan:  Discharge to home return on Tuesday for pack removal.  Davina Poke  08/20/2018 1:05 PM

## 2018-08-20 NOTE — Transfer of Care (Signed)
Immediate Anesthesia Transfer of Care Note  Patient: Eric Strickland  Procedure(s) Performed: IMAGE GUIDED SINUS SURGERY (N/A Nose) MAXILLARY ANTROSTOMY (Bilateral Nose) ETHMOIDECTOMY (Bilateral Nose) FRONTAL SINUS EXPLORATION (Bilateral Nose) TURBINATE REDUCTION/SUBMUCOSAL RESECTION (Bilateral Nose) MYRINGOTOMY WITH TUBE PLACEMENT (Bilateral Ear)  Patient Location: PACU  Anesthesia Type: General  Level of Consciousness: awake, alert  and patient cooperative  Airway and Oxygen Therapy: Patient Spontanous Breathing and Patient connected to supplemental oxygen  Post-op Assessment: Post-op Vital signs reviewed, Patient's Cardiovascular Status Stable, Respiratory Function Stable, Patent Airway and No signs of Nausea or vomiting  Post-op Vital Signs: Reviewed and stable  Complications: No apparent anesthesia complications

## 2018-08-20 NOTE — OR Nursing (Signed)
Patient brought back to the OR for epistaxis control. No count obtained. Xray taken. No retained items noted.  No formal timeout performed. Patient armband correct and same surgeon and staff as previous case verified correct patient and procedure

## 2018-08-23 ENCOUNTER — Encounter: Payer: Self-pay | Admitting: Unknown Physician Specialty

## 2018-08-24 LAB — SURGICAL PATHOLOGY

## 2018-08-25 LAB — AEROBIC/ANAEROBIC CULTURE W GRAM STAIN (SURGICAL/DEEP WOUND): Gram Stain: NONE SEEN

## 2019-04-21 ENCOUNTER — Ambulatory Visit: Payer: Self-pay | Attending: Internal Medicine

## 2019-04-21 DIAGNOSIS — Z20822 Contact with and (suspected) exposure to covid-19: Secondary | ICD-10-CM | POA: Insufficient documentation

## 2019-04-22 LAB — NOVEL CORONAVIRUS, NAA: SARS-CoV-2, NAA: NOT DETECTED

## 2020-02-14 ENCOUNTER — Encounter: Payer: Self-pay | Admitting: Emergency Medicine

## 2020-02-14 ENCOUNTER — Emergency Department
Admission: EM | Admit: 2020-02-14 | Discharge: 2020-02-14 | Disposition: A | Discharge status: Home / Self Care | Payer: Self-pay | Attending: Emergency Medicine | Admitting: Emergency Medicine

## 2020-02-14 ENCOUNTER — Other Ambulatory Visit: Payer: Self-pay

## 2020-02-14 DIAGNOSIS — L299 Pruritus, unspecified: Secondary | ICD-10-CM | POA: Diagnosis not present

## 2020-02-14 DIAGNOSIS — Z5321 Procedure and treatment not carried out due to patient leaving prior to being seen by health care provider: Secondary | ICD-10-CM | POA: Insufficient documentation

## 2020-02-14 HISTORY — DX: Depression, unspecified: F32.A

## 2020-02-14 HISTORY — DX: Anxiety disorder, unspecified: F41.9

## 2020-02-14 NOTE — ED Triage Notes (Signed)
Pt to ED from home c/o itching that started this morning and worsening throughout the day.  States redness to entire body with itching and pain.  Denies SOB, swelling, or hives.  Denies new sheets, soaps, perfumes.  States started taking sertraline and bupropion 1 week ago.  Pt chest rise even and unlabored, speaking in complete and coherent sentences, skin red, in NAD at this time.

## 2020-02-14 NOTE — ED Notes (Signed)
No answer when called several times from lobby 

## 2020-05-02 IMAGING — MR MR ANKLE*R* W/O CM
5 series · 40 of 40 positions shown · non-contrast
Comparison: None.

CLINICAL DATA: Right ankle pain for 2.5 years.

EXAM:
MRI OF THE RIGHT ANKLE WITHOUT CONTRAST
TECHNIQUE: Multiplanar, multisequence MR imaging of the ankle was performed. No
intravenous contrast was administered.

[Series 4: PD fat-sat · axial · 3.0mm · 0.56mm/px · z∈[-83,+54]mm · 8 of 39 slices shown]
[im 1/39]
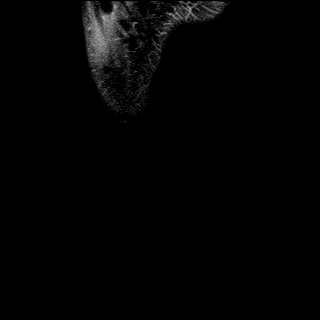
[im 6/39]
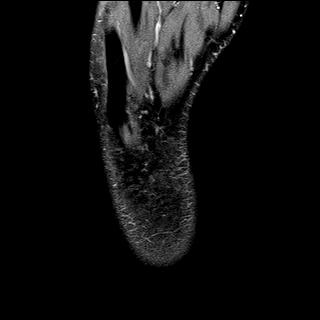
[im 11/39]
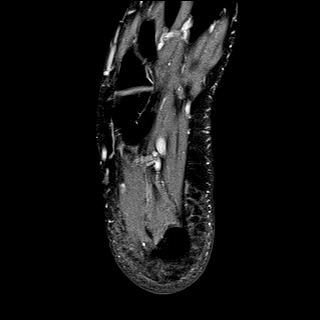
[im 17/39]
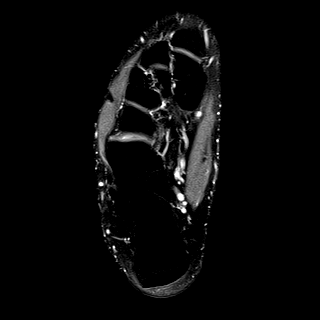
[im 22/39]
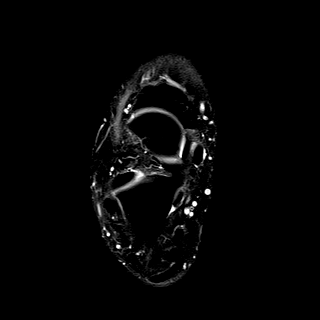
[im 28/39]
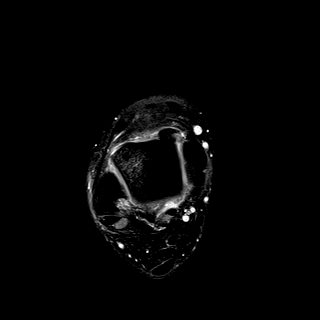
[im 33/39]
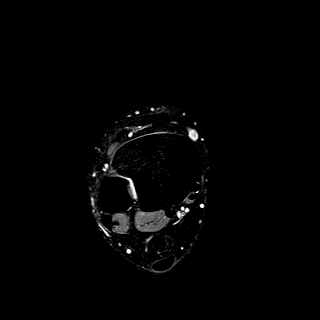
[im 39/39]
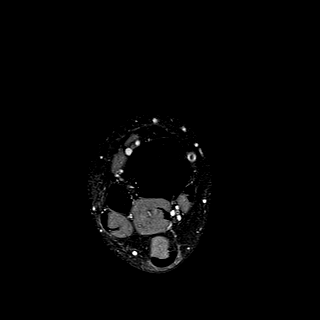

[Series 5: T2 fat-sat · axial · 3.0mm · 0.56mm/px · z∈[-83,+54]mm · 8 of 39 slices shown (1 of 2)]
[im 1/39]
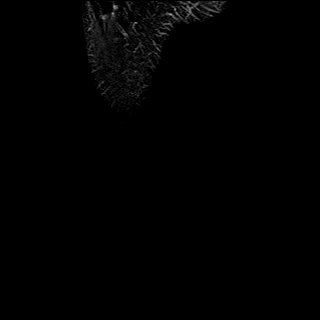
[im 6/39]
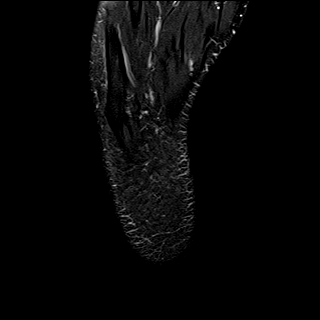
[im 11/39]
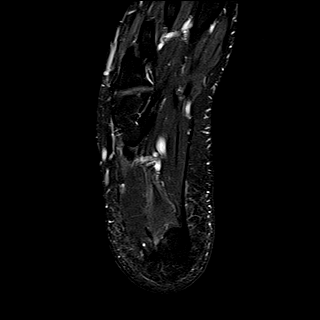
[im 17/39]
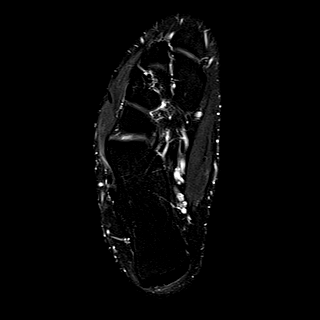
[im 22/39]
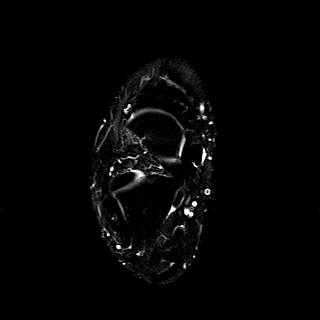
[im 28/39]
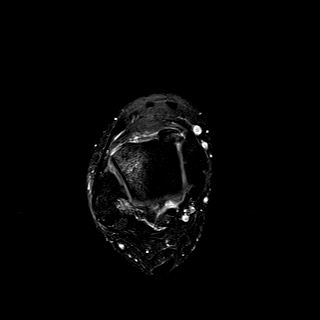
[im 33/39]
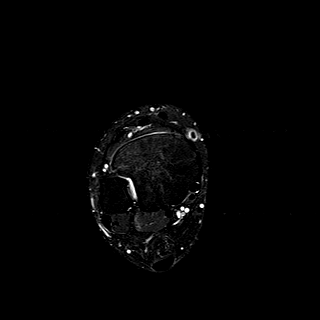
[im 39/39]
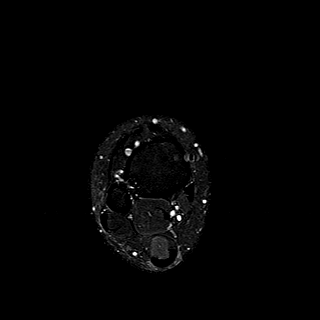

[Series 6: T2 fat-sat · coronal · 3.0mm · 0.70mm/px · 10 of 47 slices shown (2 of 2)]
[im 1/47]
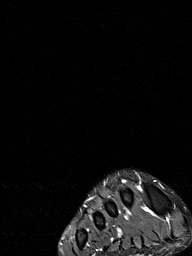
[im 6/47]
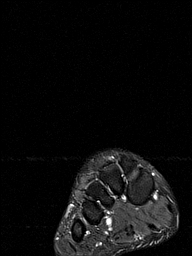
[im 11/47]
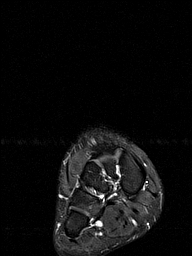
[im 16/47]
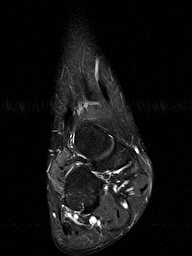
[im 21/47]
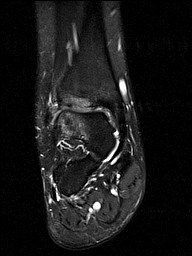
[im 26/47]
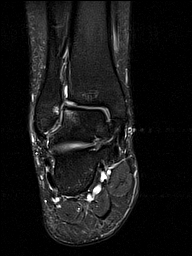
[im 31/47]
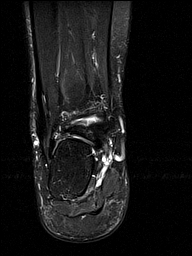
[im 36/47]
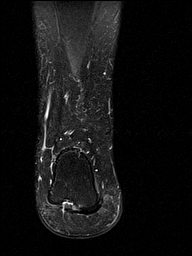
[im 41/47]
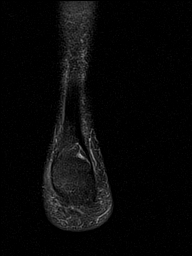
[im 47/47]
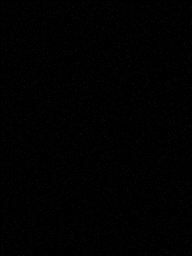

[Series 7: T1 · sagittal · 3.0mm · 0.56mm/px · 7 of 31 slices shown]
[im 1/31]
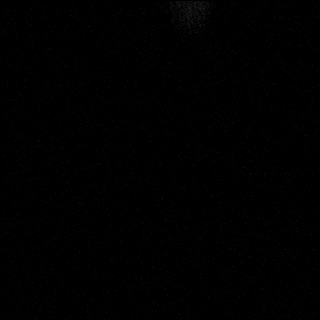
[im 6/31]
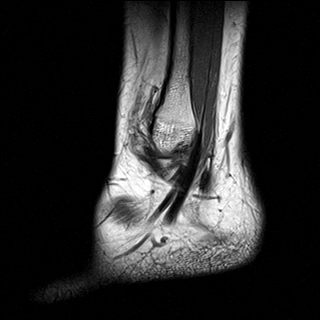
[im 11/31]
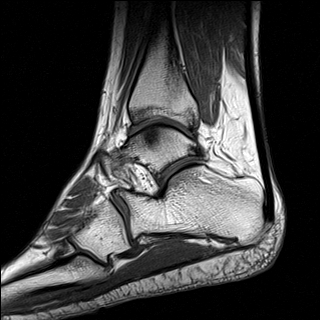
[im 16/31]
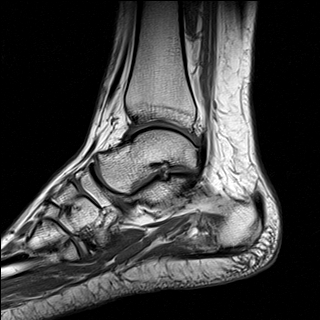
[im 21/31]
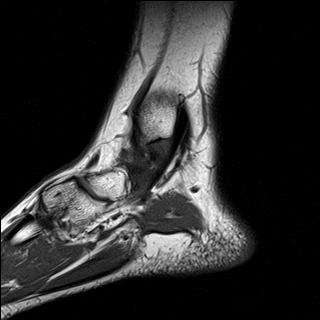
[im 26/31]
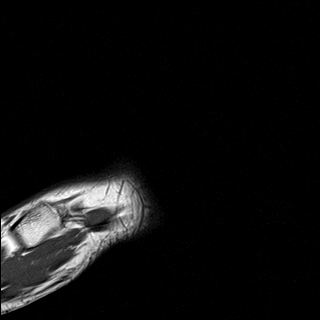
[im 31/31]
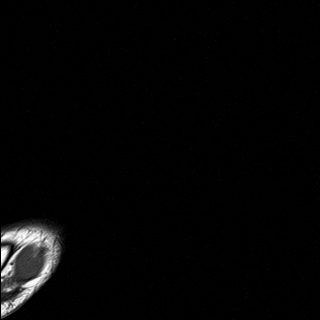

[Series 8: STIR · sagittal · 3.0mm · 0.35mm/px · 7 of 31 slices shown]
[im 1/31]
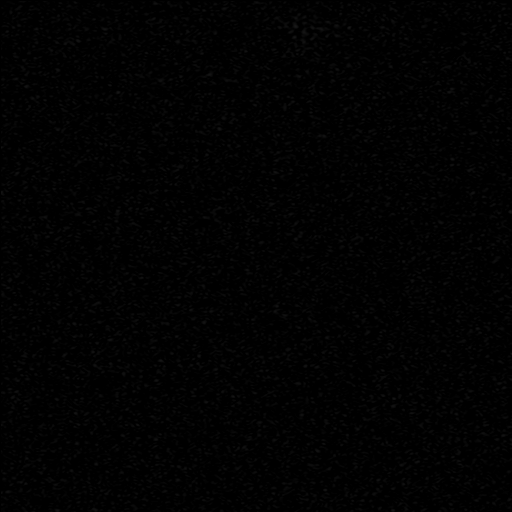
[im 6/31]
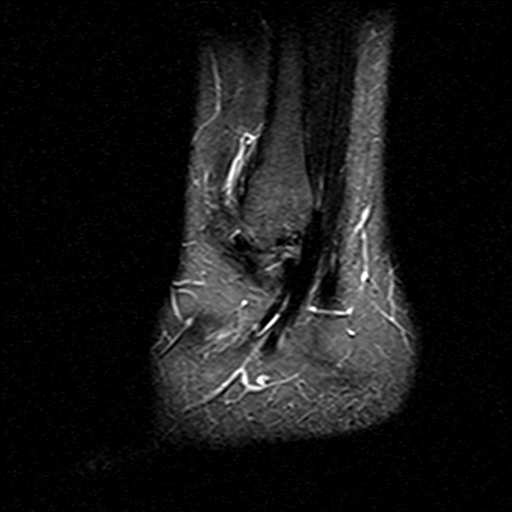
[im 11/31]
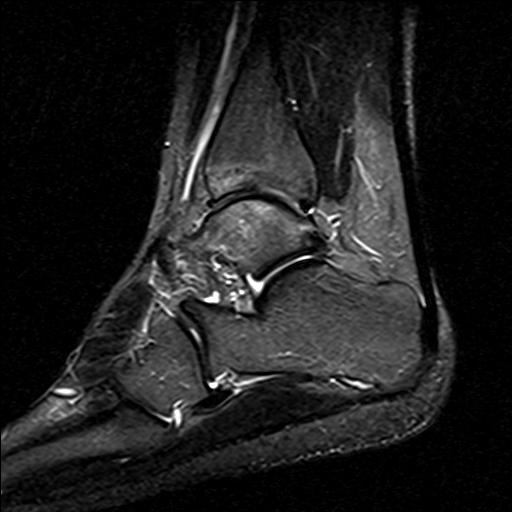
[im 16/31]
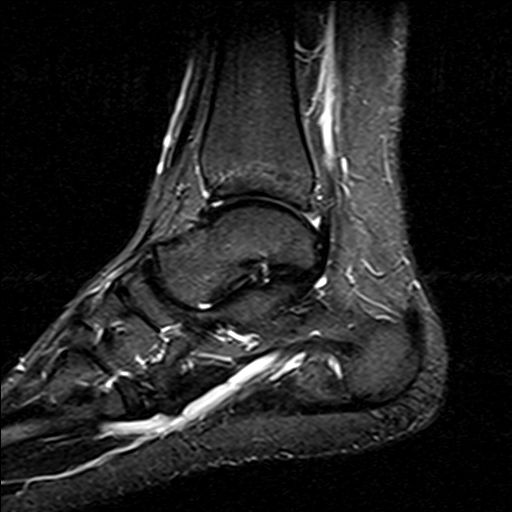
[im 21/31]
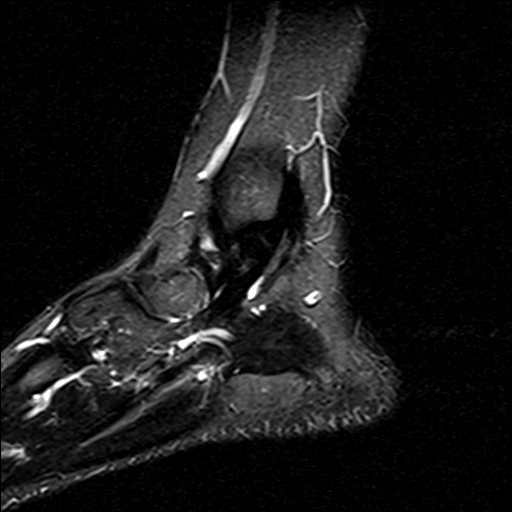
[im 26/31]
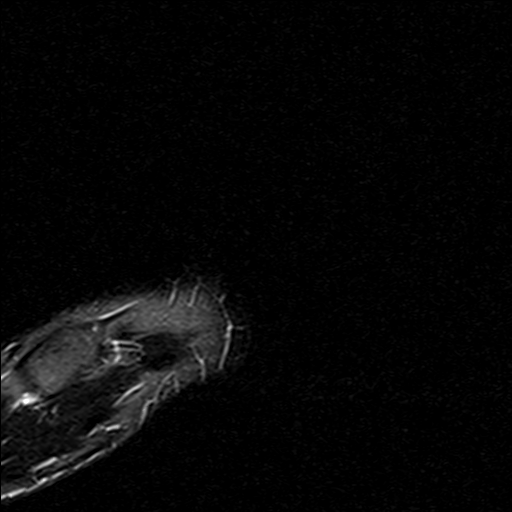
[im 31/31]
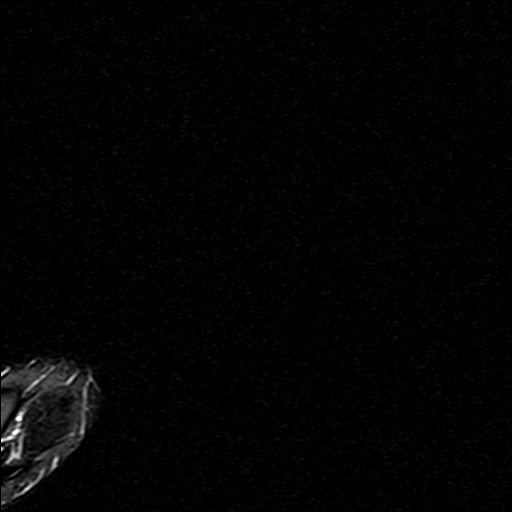

[40 of 40 positions shown; findings below may reference images not displayed]

FINDINGS: TENDONS

Peroneal: Peroneal longus tendon intact. Peroneal brevis intact.

Posteromedial: Posterior tibial tendon intact. Flexor hallucis
longus tendon intact. Flexor digitorum longus tendon intact.

Anterior: Tibialis anterior tendon intact. Extensor hallucis longus
tendon intact Extensor digitorum longus tendon intact.

Achilles:  Intact.

Plantar Fascia: Intact.

LIGAMENTS

Lateral: Anterior talofibular ligament intact. Calcaneofibular
ligament intact. Posterior talofibular ligament intact. Anterior and
posterior tibiofibular ligaments intact.

Medial: Deltoid ligament intact. Spring ligament intact.

CARTILAGE

Ankle Joint: No joint effusion. 9 x 13 mm osteochondral lesion
involving the lateral corner of the talar dome with high-grade
partial-thickness overlying cartilage loss, subchondral cystic
changes and subchondral marrow edema. High-grade partial-thickness
cartilage loss of lateral aspect of the tibial plafond. No
significant joint effusion.

Subtalar Joints/Sinus Tarsi: Normal subtalar joints. No subtalar
joint effusion. Normal sinus tarsi.

Bones: No marrow signal abnormality.  No fracture or dislocation.

Soft Tissue: No fluid collection or hematoma.  Muscles are normal.
IMPRESSION: 1. 9 x 13 mm osteochondral lesion involving the lateral corner of
the talar dome with high-grade partial-thickness overlying cartilage
loss, subchondral cystic changes and subchondral marrow edema.
2. High-grade partial-thickness cartilage loss of lateral aspect of
the tibial plafond.

## 2020-06-14 ENCOUNTER — Other Ambulatory Visit: Payer: Self-pay

## 2020-06-14 ENCOUNTER — Ambulatory Visit
Admission: EM | Admit: 2020-06-14 | Discharge: 2020-06-14 | Disposition: A | Discharge status: Home / Self Care | Payer: PRIVATE HEALTH INSURANCE | Attending: Family Medicine | Admitting: Family Medicine

## 2020-06-14 DIAGNOSIS — Z20822 Contact with and (suspected) exposure to covid-19: Secondary | ICD-10-CM | POA: Insufficient documentation

## 2020-06-14 DIAGNOSIS — R509 Fever, unspecified: Secondary | ICD-10-CM

## 2020-06-14 DIAGNOSIS — J029 Acute pharyngitis, unspecified: Secondary | ICD-10-CM | POA: Diagnosis present

## 2020-06-14 LAB — GROUP A STREP BY PCR: Group A Strep by PCR: NOT DETECTED

## 2020-06-14 LAB — RESP PANEL BY RT-PCR (FLU A&B, COVID) ARPGX2
Influenza A by PCR: NEGATIVE
Influenza B by PCR: NEGATIVE
SARS Coronavirus 2 by RT PCR: NEGATIVE

## 2020-06-14 MED ORDER — PREDNISONE 50 MG PO TABS: ORAL_TABLET | ORAL | 0 refills | Status: DC

## 2020-06-14 MED ORDER — AZITHROMYCIN 250 MG PO TABS: ORAL_TABLET | ORAL | 0 refills | Status: DC

## 2020-06-14 NOTE — ED Triage Notes (Signed)
Patient complains of fever, sore throat, body aches and headache x 2 days.

## 2020-06-14 NOTE — ED Provider Notes (Signed)
[facility]    CSN: [id number] Arrival date & time: 06/14/20  0825      History   Chief Complaint Chief Complaint  Patient presents with  . Fever   HPI  25 year old male presents with fever, sore throat, body aches, headache.  2-day history of symptoms.  Denies sick contacts.  Reports fever, sore throat, headaches, body aches.  Reports difficulty swallowing.  His pain is 9/10 in severity.  He states that he is taking Alka-Seltzer, Tylenol, DayQuil and NyQuil without resolution.  He is having difficulty sleeping.  No other reported symptoms.  No other complaints.  Past Medical History:  Diagnosis Date  . Anxiety   . Depression   . Medical history non-contributory     Patient Active Problem List   Diagnosis Date Noted  . Ankle pain 06/13/2016    Past Surgical History:  Procedure Laterality Date  . ETHMOIDECTOMY Bilateral 08/20/2018   Procedure: ETHMOIDECTOMY;  Surgeon: Linus Salmons, MD;  Location: [facility];  Service: ENT;  Laterality: Bilateral;  . FRONTAL SINUS EXPLORATION Bilateral 08/20/2018   Procedure: FRONTAL SINUS EXPLORATION;  Surgeon: Linus Salmons, MD;  Location: [facility];  Service: ENT;  Laterality: Bilateral;  . IMAGE GUIDED SINUS SURGERY N/A 08/20/2018   Procedure: IMAGE GUIDED SINUS SURGERY;  Surgeon: Linus Salmons, MD;  Location: [facility];  Service: ENT;  Laterality: N/A;  . MAXILLARY ANTROSTOMY Bilateral 08/20/2018   Procedure: MAXILLARY ANTROSTOMY;  Surgeon: Linus Salmons, MD;  Location: [facility];  Service: ENT;  Laterality: Bilateral;  . MYRINGOTOMY WITH TUBE PLACEMENT Bilateral 08/20/2018   Procedure: MYRINGOTOMY WITH TUBE PLACEMENT;  Surgeon: Linus Salmons, MD;  Location: [facility];  Service: ENT;  Laterality: Bilateral;  . NASAL ENDOSCOPY WITH EPISTAXIS CONTROL N/A 08/20/2018   Procedure: NASAL ENDOSCOPY WITH EPISTAXIS CONTROL;  Surgeon: Linus Salmons, MD;  Location: [facility];  Service: ENT;  Laterality: N/A;  . NASAL TURBINATE REDUCTION Bilateral 08/20/2018   Procedure: TURBINATE REDUCTION/SUBMUCOSAL RESECTION;  Surgeon: Linus Salmons, MD;  Location: [facility];  Service: ENT;  Laterality: Bilateral;  . WISDOM TOOTH EXTRACTION         Home Medications    Prior to Admission medications   Medication Sig Start Date End Date Taking? Authorizing Provider  azithromycin (ZITHROMAX) 250 MG tablet 2 tablets on day 1, then 1 tablet daily on days 2-5. 06/14/20  Yes Garry Nicolini G, DO  FLUoxetine (PROZAC) 10 MG capsule Take 10 mg by mouth daily. 01/19/20  Yes [provider]  predniSONE (DELTASONE) 50 MG tablet 1 tablet daily x 5 days 06/14/20  Yes Maikayla Beggs G, DO  nortriptyline (PAMELOR) 10 MG capsule Take 10 mg by mouth at bedtime.  06/14/20  [provider]    Family History Family History  Problem Relation Age of Onset  . Healthy Mother   . Healthy Father     Social History Social History   Tobacco Use  . Smoking status: Never Smoker  . Smokeless tobacco: Never Used  Vaping Use  . Vaping Use: Former  . Substances: Flavoring  . Devices: [employer]  Substance Use Topics  . Alcohol use: Yes    Comment: may have drink 3-4x/yr  . Drug use: Never     Allergies   Bupropion and Sertraline   Review of Systems Review of Systems  Constitutional: Positive for fever.  HENT: Positive for sore throat and trouble swallowing.   Musculoskeletal:       Body aches.  Neurological: Positive for headaches.   Physical Exam Triage Vital Signs ED Triage Vitals  Enc Vitals Group     BP 06/14/20 0838 126/76     Pulse Rate 06/14/20 0838 (!) 120     Resp 06/14/20 0838 18     Temp 06/14/20 0838 100.2 F (37.9 C)     Temp Source 06/14/20 0838 Oral     SpO2 06/14/20 0838 98 %     Weight 06/14/20 0835 270 lb (122.5 kg)     Height 06/14/20 0835 6' (1.829 m)     Head Circumference --      Peak Flow --      Pain Score 06/14/20  0834 9     Pain Loc --      Pain Edu? --      Excl. in GC? --    Updated Vital Signs BP 126/76   Pulse (!) 120   Temp 100.2 F (37.9 C) (Oral)   Resp 18   Ht 6' (1.829 m)   Wt 122.5 kg   SpO2 98%   BMI 36.62 kg/m   Visual Acuity Right Eye Distance:   Left Eye Distance:   Bilateral Distance:    Right Eye Near:   Left Eye Near:    Bilateral Near:     Physical Exam Constitutional:      General: He is not in acute distress.    Appearance: He is obese. He is not ill-appearing.  HENT:     Head: Normocephalic and atraumatic.     Mouth/Throat:     Pharynx: Uvula midline. Posterior oropharyngeal erythema present.     Tonsils: Tonsillar exudate present. 3+ on the right. 3+ on the left.  Eyes:     General:        Right eye: No discharge.        Left eye: No discharge.     Conjunctiva/sclera: Conjunctivae normal.  Cardiovascular:     Rate and Rhythm: Regular rhythm. Tachycardia present.  Pulmonary:     Effort: Pulmonary effort is normal.     Breath sounds: Normal breath sounds. No wheezing, rhonchi or rales.  Neurological:     Mental Status: He is alert.  Psychiatric:        Mood and Affect: Mood normal.        Behavior: Behavior normal.    UC Treatments / Results  Labs (all labs ordered are listed, but only abnormal results are displayed) Labs Reviewed  GROUP A STREP BY PCR  RESP PANEL BY RT-PCR (FLU A&B, COVID) [id number]    EKG   Radiology No results found.  Procedures Procedures (including critical care time)  Medications Ordered in UC Medications - No data to display  Initial Impression / Assessment and Plan / UC Course  I have reviewed the triage vital signs and the nursing notes.  Pertinent labs & imaging results that were available during my care of the patient were reviewed by me and considered in my medical decision making (see chart for details).    25 year old male presents with fever, sore throat, body aches, and headache.  Acute illness with  systemic symptoms.  Flu and Covid testing negative.  Strep PCR negative as well.  Given his physical exam findings, I am concerned about atypical pathogens causing pharyngitis.  Possible mono.  Placing empirically on azithromycin given the severity of symptoms.  Prednisone as well.  Work note given.  Final Clinical Impressions(s) / UC Diagnoses   Final diagnoses:  Febrile  illness  Acute pharyngitis, unspecified etiology     Discharge Instructions     Rest. Fluids.  Medication as prescribed.  Take care  Dr. Adriana Simas    ED Prescriptions    Medication Sig Dispense Auth. Provider   azithromycin (ZITHROMAX) 250 MG tablet 2 tablets on day 1, then 1 tablet daily on days 2-5. 6 tablet Juanetta Negash G, DO   predniSONE (DELTASONE) 50 MG tablet 1 tablet daily x 5 days 5 tablet Everlene Other G, DO     PDMP not reviewed this encounter.   Tommie Sams, [place] 06/14/20 1056

## 2020-06-14 NOTE — Discharge Instructions (Signed)
Rest. Fluids.  Medication as prescribed.   Take care  Dr. Prynce Jacober  

## 2020-07-25 ENCOUNTER — Ambulatory Visit
Admission: EM | Admit: 2020-07-25 | Discharge: 2020-07-25 | Disposition: A | Discharge status: Home / Self Care | Payer: PRIVATE HEALTH INSURANCE | Attending: Sports Medicine | Admitting: Sports Medicine

## 2020-07-25 ENCOUNTER — Other Ambulatory Visit: Payer: Self-pay

## 2020-07-25 DIAGNOSIS — M7522 Bicipital tendinitis, left shoulder: Secondary | ICD-10-CM | POA: Diagnosis not present

## 2020-07-25 DIAGNOSIS — M7582 Other shoulder lesions, left shoulder: Secondary | ICD-10-CM

## 2020-07-25 DIAGNOSIS — M25512 Pain in left shoulder: Secondary | ICD-10-CM | POA: Diagnosis not present

## 2020-07-25 DIAGNOSIS — M7542 Impingement syndrome of left shoulder: Secondary | ICD-10-CM

## 2020-07-25 DIAGNOSIS — M7502 Adhesive capsulitis of left shoulder: Secondary | ICD-10-CM

## 2020-07-25 MED ORDER — DICLOFENAC SODIUM 75 MG PO TBEC: 75.0000 mg | DELAYED_RELEASE_TABLET | Freq: Two times a day (BID) | ORAL | 0 refills | Status: DC

## 2020-07-25 NOTE — ED Triage Notes (Signed)
Pt c/o left shoulder pain for several weeks. Pt denies any known injury. Pt states the pain is not improving. He has taken Tylenol, Advil and muscle relaxers with no improvement.

## 2020-07-25 NOTE — Discharge Instructions (Addendum)
As we discussed, I am prescribing you a anti-inflammatory.  No Motrin Advil ibuprofen Naprosyn Aleve. You can take Tylenol only over-the-counter. I provided you the information for a sports medicine physician.  Dr. Joseph Berkshire.  Please contact his office and get an appointment to be evaluated.  As we discussed you may benefit from physical therapy and home exercises.  Given the weakness I cannot fully rule out a tear of your infraspinatus or teres minor which are part of your rotator cuff.  That said without trauma I doubt that she do have an actual tear and I believe care outside of an MRI should be the initial way to manage her situation.

## 2020-07-25 NOTE — ED Provider Notes (Signed)
[facility]    CSN: [id number] Arrival date & time: 07/25/20  1601      History   Chief Complaint Chief Complaint  Patient presents with  . Shoulder Pain    left    HPI Eric Strickland is a 25 y.o. male.   Patient is a pleasant 25 year old right-hand-dominant male who presents for evaluation of left shoulder pain.  He normally sees [facility] here in [place] but they were unable to see him today.  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  He has had left shoulder pain for about 2 weeks.  No accidents trauma falls or twists.  Points over the anterior aspect of his shoulder as the point of maximal tenderness.  He has a fairly physical job and has been doing a very long.  Certainly increasing his activity level.  No swelling noted.  No fever shakes chills.  No chronic issues with that shoulder.  He has had no injuries in the past.  He has taken a muscle relaxer, over-the-counter NSAIDs, and over-the-counter Tylenol with limited success.  He denies any neck pain, numbness or tingling in any extremity, and no cervical radiculopathy on history.  He denies chest pain or shortness of breath.  No recent vaccinations.  No red flag signs or symptoms elicited on history.     Past Medical History:  Diagnosis Date  . Anxiety   . Depression   . Medical history non-contributory     Patient Active Problem List   Diagnosis Date Noted  . Ankle pain 06/13/2016    Past Surgical History:  Procedure Laterality Date  . ETHMOIDECTOMY Bilateral 08/20/2018   Procedure: ETHMOIDECTOMY;  Surgeon: Linus Salmons, MD;  Location: [facility];  Service: ENT;  Laterality: Bilateral;  . FRONTAL SINUS EXPLORATION Bilateral 08/20/2018   Procedure: FRONTAL SINUS EXPLORATION;  Surgeon: Linus Salmons, MD;  Location: [facility];  Service: ENT;  Laterality: Bilateral;  . IMAGE GUIDED SINUS SURGERY N/A 08/20/2018    Procedure: IMAGE GUIDED SINUS SURGERY;  Surgeon: Linus Salmons, MD;  Location: [facility];  Service: ENT;  Laterality: N/A;  . MAXILLARY ANTROSTOMY Bilateral 08/20/2018   Procedure: MAXILLARY ANTROSTOMY;  Surgeon: Linus Salmons, MD;  Location: [facility];  Service: ENT;  Laterality: Bilateral;  . MYRINGOTOMY WITH TUBE PLACEMENT Bilateral 08/20/2018   Procedure: MYRINGOTOMY WITH TUBE PLACEMENT;  Surgeon: Linus Salmons, MD;  Location: [facility];  Service: ENT;  Laterality: Bilateral;  . NASAL ENDOSCOPY WITH EPISTAXIS CONTROL N/A 08/20/2018   Procedure: NASAL ENDOSCOPY WITH EPISTAXIS CONTROL;  Surgeon: Linus Salmons, MD;  Location: [facility];  Service: ENT;  Laterality: N/A;  . NASAL TURBINATE REDUCTION Bilateral 08/20/2018   Procedure: TURBINATE REDUCTION/SUBMUCOSAL RESECTION;  Surgeon: Linus Salmons, MD;  Location: [facility];  Service: ENT;  Laterality: Bilateral;  . WISDOM TOOTH EXTRACTION         Home Medications    Prior to Admission medications   Medication Sig Start Date End Date Taking? Authorizing Provider  diclofenac (VOLTAREN) 75 MG EC tablet Take 1 tablet (75 mg total) by mouth 2 (two) times daily. Take with food. No other nsaids 07/25/20  Yes Delton See, MD  FLUoxetine (PROZAC) 10 MG capsule Take 10 mg by mouth daily. 01/19/20  Yes [provider]  azithromycin (ZITHROMAX) 250 MG tablet 2 tablets on day 1, then 1 tablet daily on days 2-5. 06/14/20   Everlene Other  G, DO  predniSONE (DELTASONE) 50 MG tablet 1 tablet daily x 5 days 06/14/20   Tommie Sams, DO  nortriptyline (PAMELOR) 10 MG capsule Take 10 mg by mouth at bedtime.  06/14/20  [provider]    Family History Family History  Problem Relation Age of Onset  . Healthy Mother   . Healthy Father     Social History Social History   Tobacco Use  . Smoking status: Never Smoker  . Smokeless tobacco: Never Used  Vaping Use  . Vaping Use: Former   . Substances: Flavoring  . Devices: [employer]  Substance Use Topics  . Alcohol use: Yes    Comment: may have drink 3-4x/yr  . Drug use: Never     Allergies   Bupropion and Sertraline   Review of Systems Review of Systems  Constitutional: Positive for activity change. Negative for appetite change, chills, diaphoresis, fatigue and fever.  HENT: Negative.  Negative for congestion and sore throat.   Eyes: Negative.  Negative for pain.  Respiratory: Negative.  Negative for cough, chest tightness, shortness of breath, wheezing and stridor.   Cardiovascular: Negative.  Negative for chest pain and palpitations.  Gastrointestinal: Negative.  Negative for abdominal pain, blood in stool, constipation, diarrhea, nausea and vomiting.  Genitourinary: Negative.  Negative for dysuria.  Musculoskeletal: Positive for arthralgias. Negative for back pain, gait problem, joint swelling, myalgias, neck pain and neck stiffness.  Skin: Negative.  Negative for color change, pallor, rash and wound.  Neurological: Negative.  Negative for dizziness, syncope, weakness, light-headedness, numbness and headaches.  Hematological: Negative.  Negative for adenopathy. Does not bruise/bleed easily.  All other systems reviewed and are negative.    Physical Exam Triage Vital Signs ED Triage Vitals  Enc Vitals Group     BP 07/25/20 1648 124/69     Pulse Rate 07/25/20 1648 68     Resp 07/25/20 1648 18     Temp 07/25/20 1648 98.5 F (36.9 C)     Temp Source 07/25/20 1648 Oral     SpO2 07/25/20 1648 98 %     Weight 07/25/20 1647 275 lb (124.7 kg)     Height 07/25/20 1647 6' (1.829 m)     Head Circumference --      Peak Flow --      Pain Score 07/25/20 1647 9     Pain Loc --      Pain Edu? --      Excl. in GC? --    No data found.  Updated Vital Signs BP 124/69 (BP Location: Left Arm)   Pulse 68   Temp 98.5 F (36.9 C) (Oral)   Resp 18   Ht 6' (1.829 m)   Wt 124.7 kg   SpO2 98%   BMI 37.30 kg/m    Visual Acuity Right Eye Distance:   Left Eye Distance:   Bilateral Distance:    Right Eye Near:   Left Eye Near:    Bilateral Near:     Physical Exam Vitals and nursing note reviewed.  Constitutional:      General: He is not in acute distress.    Appearance: Normal appearance. He is not ill-appearing, toxic-appearing or diaphoretic.  HENT:     Head: Normocephalic and atraumatic.     Right Ear: Tympanic membrane normal.     Left Ear: Tympanic membrane normal.     Nose: Nose normal. No congestion or rhinorrhea.     Mouth/Throat:     Mouth: Mucous  membranes are moist.     Pharynx: No oropharyngeal exudate or posterior oropharyngeal erythema.  Eyes:     General: No scleral icterus.       Right eye: No discharge.        Left eye: No discharge.     Extraocular Movements: Extraocular movements intact.     Conjunctiva/sclera: Conjunctivae normal.     Pupils: Pupils are equal, round, and reactive to light.  Cardiovascular:     Rate and Rhythm: Normal rate and regular rhythm.     Pulses: Normal pulses.     Heart sounds: Normal heart sounds. No murmur heard. No friction rub. No gallop.   Pulmonary:     Effort: Pulmonary effort is normal. No respiratory distress.     Breath sounds: Normal breath sounds. No stridor. No wheezing, rhonchi or rales.  Musculoskeletal:        General: Tenderness present. No deformity or signs of injury.     Right shoulder: Normal.     Left shoulder: Tenderness present. No swelling, deformity, effusion, laceration, bony tenderness or crepitus. Decreased range of motion. Decreased strength.     Cervical back: Normal range of motion and neck supple. No rigidity or tenderness.     Right lower leg: No edema.     Left lower leg: No edema.     Comments: Right shoulder: Normal to inspection palpation range of motion special test.  Left shoulder: No obvious bony abnormality ecchymosis erythema soft tissue swelling or rash.  Does have decreased range of motion  secondary to pain and lacks about 5 degrees of terminal abduction terminal forward flexion.  Internal rotation above the iliac crest with discomfort.  He is developing early frozen shoulder.  He is tender palpation over the bicipital groove anteriorly.  Strength testing reveals 4 out of 5 with external rotation.  The rest of it is 5 out of 5.  His weakness is due to pain.  Does not appear to be a functional limitation.  Empty can test is negative.  There does not appear to be any significant rotator cuff weakness.  Supraspinatus is intact.  Weakness seems to be with infraspinatus and teres minor strength testing.  Hawkins test is positive.  Impingement test is positive.  There is no laxity.  Negative sulcus sign.  No increased anterior posterior translation.  Apprehension test is negative.  Neurovascular: Normal sensation 2+ pulses distally.  Lymphadenopathy:     Cervical: No cervical adenopathy.  Skin:    General: Skin is warm and dry.     Capillary Refill: Capillary refill takes less than 2 seconds.     Findings: No bruising, erythema, lesion or rash.  Neurological:     General: No focal deficit present.     Mental Status: He is alert and oriented to person, place, and time.      UC Treatments / Results  Labs (all labs ordered are listed, but only abnormal results are displayed) Labs Reviewed - No data to display  EKG   Radiology No results found.  Procedures Procedures (including critical care time)  Medications Ordered in UC Medications - No data to display  Initial Impression / Assessment and Plan / UC Course  I have reviewed the triage vital signs and the nursing notes.  Pertinent labs & imaging results that were available during my care of the patient were reviewed by me and considered in my medical decision making (see chart for details).  Clinical impression: Left shoulder pain mostly anterior.  He has some weakness due to pain with internal rotation and has early  adhesive capsulitis.  Also he has some weakness with resisted external rotation without trauma.  Appears to be all inflammatory in nature.  Treatment plan: 1.  The findings and treatment plan were discussed in detail with the patient.  Patient was in agreement. 2.  I recommended sports medicine referral.  Gave him the information for Dr. Ashley Royalty to make an appointment. 3.  We will do a trial of Voltaren 75 mg 1 p.o. twice daily with food.  Warned him not to take any other NSAIDs including Advil, Motrin, ibuprofen, Naprosyn, Aleve, or aspirin products.  He can take Tylenol. 4.  I offered a work note but he deferred. 5.  If he be a great candidate for formal physical therapy with home exercises but I will leave that to sports medicine. 6.  Educational handouts were provided. 7.  Activity modification, avoid those activities that flared up.  Proper stretching prior to activity and icing afterwards.  This was reinforced. 8.  He was discharged from care in stable condition.  He will follow-up here as needed.    Final Clinical Impressions(s) / UC Diagnoses   Final diagnoses:  Acute pain of left shoulder  Rotator cuff tendinitis, left  Impingement syndrome of left shoulder  Biceps tendinitis of left upper extremity  Adhesive capsulitis of left shoulder     Discharge Instructions     As we discussed, I am prescribing you a anti-inflammatory.  No Motrin Advil ibuprofen Naprosyn Aleve. You can take Tylenol only over-the-counter. I provided you the information for a sports medicine physician.  Dr. Joseph Berkshire.  Please contact his office and get an appointment to be evaluated.  As we discussed you may benefit from physical therapy and home exercises.  Given the weakness I cannot fully rule out a tear of your infraspinatus or teres minor which are part of your rotator cuff.  That said without trauma I doubt that she do have an actual tear and I believe care outside of an MRI should be the initial  way to manage her situation.    ED Prescriptions    Medication Sig Dispense Auth. Provider   diclofenac (VOLTAREN) 75 MG EC tablet Take 1 tablet (75 mg total) by mouth 2 (two) times daily. Take with food. No other nsaids 60 tablet Delton See, MD     PDMP not reviewed this encounter.   Delton See, MD 07/26/20 1407

## 2022-02-19 ENCOUNTER — Ambulatory Visit (INDEPENDENT_AMBULATORY_CARE_PROVIDER_SITE_OTHER): Payer: Managed Care, Other (non HMO) | Admitting: Podiatry

## 2022-02-19 ENCOUNTER — Encounter: Payer: Self-pay | Admitting: Podiatry

## 2022-02-19 DIAGNOSIS — L03031 Cellulitis of right toe: Secondary | ICD-10-CM

## 2022-02-19 MED ORDER — CEPHALEXIN 500 MG PO CAPS: 500.0000 mg | ORAL_CAPSULE | Freq: Three times a day (TID) | ORAL | 0 refills | Status: DC

## 2022-02-19 NOTE — Progress Notes (Signed)
Subjective:  Patient ID: Eric Strickland, male    DOB: [birth year],  MRN: [id number] HPI Chief Complaint  Patient presents with   Toe Pain    Hallux right - redness around base of nail, soaking, thought maybe an ingrown toenail, but hasn't really been sore   New Patient (Initial Visit)    Est pt 2019    26 y.o. male presents with the above complaint.   ROS: Denies fever chills nausea vomit muscle aches pains calf pain back pain chest pain shortness of breath.  Past Medical History:  Diagnosis Date   Anxiety    Depression    Medical history non-contributory    Past Surgical History:  Procedure Laterality Date   ETHMOIDECTOMY Bilateral 08/20/2018   Procedure: ETHMOIDECTOMY;  Surgeon: Linus Salmons, MD;  Location: [facility];  Service: ENT;  Laterality: Bilateral;   FRONTAL SINUS EXPLORATION Bilateral 08/20/2018   Procedure: FRONTAL SINUS EXPLORATION;  Surgeon: Linus Salmons, MD;  Location: [facility];  Service: ENT;  Laterality: Bilateral;   IMAGE GUIDED SINUS SURGERY N/A 08/20/2018   Procedure: IMAGE GUIDED SINUS SURGERY;  Surgeon: Linus Salmons, MD;  Location: [facility];  Service: ENT;  Laterality: N/A;   MAXILLARY ANTROSTOMY Bilateral 08/20/2018   Procedure: MAXILLARY ANTROSTOMY;  Surgeon: Linus Salmons, MD;  Location: [facility];  Service: ENT;  Laterality: Bilateral;   MYRINGOTOMY WITH TUBE PLACEMENT Bilateral 08/20/2018   Procedure: MYRINGOTOMY WITH TUBE PLACEMENT;  Surgeon: Linus Salmons, MD;  Location: [facility];  Service: ENT;  Laterality: Bilateral;   NASAL ENDOSCOPY WITH EPISTAXIS CONTROL N/A 08/20/2018   Procedure: NASAL ENDOSCOPY WITH EPISTAXIS CONTROL;  Surgeon: Linus Salmons, MD;  Location: [facility];  Service: ENT;  Laterality: N/A;   NASAL TURBINATE REDUCTION Bilateral 08/20/2018   Procedure: TURBINATE REDUCTION/SUBMUCOSAL RESECTION;  Surgeon: Linus Salmons, MD;  Location: [facility];   Service: ENT;  Laterality: Bilateral;   WISDOM TOOTH EXTRACTION      Current Outpatient Medications:    cephALEXin (KEFLEX) 500 MG capsule, Take 1 capsule (500 mg total) by mouth 3 (three) times daily., Disp: 30 capsule, Rfl: 0  Allergies  Allergen Reactions   Bupropion Rash   Sertraline Rash   Review of Systems Objective:  There were no vitals filed for this visit.  General: Well developed, nourished, in no acute distress, alert and oriented x3   Dermatological: Skin is warm, dry and supple bilateral. Nails x 10 are well maintained; remaining integument appears unremarkable at this time. There are no open sores, no preulcerative lesions, no rash or signs of infection present.  Ingrown toenail tibial border hallux right.  No purulence on palpation today.  Just mild tenderness.  Mild erythema along the proximal nail fold.  Vascular: Dorsalis Pedis artery and Posterior Tibial artery pedal pulses are 2/4 bilateral with immedate capillary fill time. Pedal hair growth present. No varicosities and no lower extremity edema present bilateral.   Neruologic: Grossly intact via light touch bilateral. Vibratory intact via tuning fork bilateral. Protective threshold with Semmes Wienstein monofilament intact to all pedal sites bilateral. Patellar and Achilles deep tendon reflexes 2+ bilateral. No Babinski or clonus noted bilateral.   Musculoskeletal: No gross boney pedal deformities bilateral. No pain, crepitus, or limitation noted with foot and ankle range of motion bilateral. Muscular strength 5/5 in all groups tested bilateral.  Gait: Unassisted, Nonantalgic.    Radiographs:  None taken  Assessment & Plan:   Assessment: Ingrown nail paronychia hallux right  Plan: He  will start or continue Epsom salts warm water soaks once to twice daily.  We will also start him on Keflex 500 mg 1 p.o. 3 times daily and I will follow-up with him in 2 weeks may need to consider matricectomy at that  time     Tiler Brandis T. [place], [place]

## 2022-03-05 ENCOUNTER — Ambulatory Visit: Payer: Managed Care, Other (non HMO) | Admitting: Podiatry

## 2023-06-12 ENCOUNTER — Encounter: Payer: Self-pay | Admitting: Podiatry

## 2023-06-12 ENCOUNTER — Ambulatory Visit (INDEPENDENT_AMBULATORY_CARE_PROVIDER_SITE_OTHER)

## 2023-06-12 ENCOUNTER — Ambulatory Visit: Admitting: Podiatry

## 2023-06-12 DIAGNOSIS — S99911S Unspecified injury of right ankle, sequela: Secondary | ICD-10-CM | POA: Diagnosis not present

## 2023-06-12 DIAGNOSIS — M79671 Pain in right foot: Secondary | ICD-10-CM

## 2023-06-12 DIAGNOSIS — M25371 Other instability, right ankle: Secondary | ICD-10-CM | POA: Diagnosis not present

## 2023-06-12 DIAGNOSIS — M7751 Other enthesopathy of right foot: Secondary | ICD-10-CM

## 2023-06-12 MED ORDER — METHYLPREDNISOLONE 4 MG PO TBPK: ORAL_TABLET | ORAL | 0 refills | Status: DC

## 2023-06-12 MED ORDER — MELOXICAM 15 MG PO TABS: 15.0000 mg | ORAL_TABLET | Freq: Every day | ORAL | 1 refills | Status: DC

## 2023-06-12 NOTE — Progress Notes (Signed)
 Chief Complaint  Patient presents with   Foot Pain    Right ankle never healed from previous injuries(08/19/2017: DR.Hyatt)    HPI: 28 year old male presenting today as a reestablish new patient for evaluation of chronic pain and tenderness to the right ankle.  History of right ankle injury 2019.  He continues to have pain and tenderness.  Patient also states that his ankle will randomly give out and he will fall.  He says that the ankle is chronically unstable.   Past Medical History:  Diagnosis Date   Anxiety    Depression    Medical history non-contributory    Past Surgical History:  Procedure Laterality Date   ETHMOIDECTOMY Bilateral 08/20/2018   Procedure: ETHMOIDECTOMY;  Surgeon: Linus Salmons, MD;  Location: [facility];  Service: ENT;  Laterality: Bilateral;   FRONTAL SINUS EXPLORATION Bilateral 08/20/2018   Procedure: FRONTAL SINUS EXPLORATION;  Surgeon: Linus Salmons, MD;  Location: [facility];  Service: ENT;  Laterality: Bilateral;   IMAGE GUIDED SINUS SURGERY N/A 08/20/2018   Procedure: IMAGE GUIDED SINUS SURGERY;  Surgeon: Linus Salmons, MD;  Location: [facility];  Service: ENT;  Laterality: N/A;   MAXILLARY ANTROSTOMY Bilateral 08/20/2018   Procedure: MAXILLARY ANTROSTOMY;  Surgeon: Linus Salmons, MD;  Location: [facility];  Service: ENT;  Laterality: Bilateral;   MYRINGOTOMY WITH TUBE PLACEMENT Bilateral 08/20/2018   Procedure: MYRINGOTOMY WITH TUBE PLACEMENT;  Surgeon: Linus Salmons, MD;  Location: [facility];  Service: ENT;  Laterality: Bilateral;   NASAL ENDOSCOPY WITH EPISTAXIS CONTROL N/A 08/20/2018   Procedure: NASAL ENDOSCOPY WITH EPISTAXIS CONTROL;  Surgeon: Linus Salmons, MD;  Location: [facility];  Service: ENT;  Laterality: N/A;   NASAL TURBINATE REDUCTION Bilateral 08/20/2018   Procedure: TURBINATE REDUCTION/SUBMUCOSAL RESECTION;  Surgeon: Linus Salmons, MD;  Location: [facility];  Service:  ENT;  Laterality: Bilateral;   WISDOM TOOTH EXTRACTION     Allergies  Allergen Reactions   Bupropion Rash   Sertraline Rash      Physical Exam: General: The patient is alert and oriented x3 in no acute distress.  Dermatology: Skin is warm, dry and supple bilateral lower extremities. Negative for open lesions or macerations.  Vascular: Palpable pedal pulses bilaterally. No edema or erythema noted. Capillary refill within normal limits.  Neurological: Grossly intact via light touch  Musculoskeletal Exam: Tenderness with palpation around the right ankle joint.  Positive anterior drawer sign.  Chronic ankle instability also noted.  Radiographic exam RT ankle 06/12/2023: Normal osseous mineralization.  Tibiotalar joint congruent.  There is some anterior spurring of the talus and tibia.  No acute fracture identified.  Assessment: 1.  Chronic right ankle pain secondary to injury 2019 2.  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  X-rays reviewed -Patient continues to have pain and tenderness associated to the right ankle due to long history of injury.  He continues to have pain on a daily basis. -Order placed for MRI RT ankle wo contrast.  Unfortunately he has failed conservative treatment and this has been ongoing now for several years with severe pain on a daily basis. -Prescription for Medrol Dosepak -Prescription for meloxicam 15 mg daily -Return to clinic after MRI results are available to review results and discuss further treatment options     Felecia Shelling, DPM [facility]  Dr. Felecia Shelling, DPM    [address]  [place], [place] [address]                Office [phone]  Fax [phone]

## 2023-06-15 ENCOUNTER — Other Ambulatory Visit: Payer: Self-pay

## 2023-06-15 ENCOUNTER — Encounter: Payer: Self-pay | Admitting: Podiatry

## 2023-06-15 MED ORDER — MELOXICAM 15 MG PO TABS: 15.0000 mg | ORAL_TABLET | Freq: Every day | ORAL | 1 refills | Status: AC

## 2023-06-15 MED ORDER — METHYLPREDNISOLONE 4 MG PO TBPK: ORAL_TABLET | ORAL | 0 refills | Status: AC

## 2023-06-18 ENCOUNTER — Telehealth: Payer: Self-pay

## 2023-06-18 NOTE — Telephone Encounter (Signed)
 Additional information needed for MRI PA.    ALL NOTES WERE SENT BEFORE THIS NOTICE CAME IN

## 2023-06-18 NOTE — Addendum Note (Signed)
 Addended by: Felecia Shelling on: 06/18/2023 10:28 AM   Modules accepted: Orders

## 2023-06-19 ENCOUNTER — Other Ambulatory Visit: Payer: Self-pay | Admitting: Podiatry

## 2023-06-19 DIAGNOSIS — M25371 Other instability, right ankle: Secondary | ICD-10-CM

## 2023-06-19 DIAGNOSIS — M7751 Other enthesopathy of right foot: Secondary | ICD-10-CM

## 2023-06-19 NOTE — Telephone Encounter (Signed)
 Patient was approved fro [facility]. NO P2P NEEDED

## 2023-06-22 ENCOUNTER — Ambulatory Visit
Admission: RE | Admit: 2023-06-22 | Discharge: 2023-06-22 | Disposition: A | Discharge status: Home / Self Care | Source: Ambulatory Visit | Attending: Podiatry

## 2023-06-22 ENCOUNTER — Other Ambulatory Visit: Payer: Self-pay

## 2023-06-22 DIAGNOSIS — M25371 Other instability, right ankle: Secondary | ICD-10-CM

## 2023-06-22 DIAGNOSIS — M7751 Other enthesopathy of right foot: Secondary | ICD-10-CM

## 2023-07-02 ENCOUNTER — Encounter: Payer: Self-pay | Admitting: Podiatry

## 2023-07-17 ENCOUNTER — Ambulatory Visit: Admitting: Podiatry

## 2023-07-17 ENCOUNTER — Encounter: Payer: Self-pay | Admitting: Podiatry

## 2023-07-17 DIAGNOSIS — M722 Plantar fascial fibromatosis: Secondary | ICD-10-CM | POA: Diagnosis not present

## 2023-07-17 NOTE — Progress Notes (Signed)
 Chief Complaint  Patient presents with   Foot Pain    "It's good.  I come to get my results."    HPI: 28 year old male presenting today as a reestablish new patient for evaluation of chronic pain and tenderness to the right ankle.  History of right ankle injury 2019.  He continues to have pain and tenderness.  Patient also states that his ankle will randomly give out and he will fall.  He says that the ankle is chronically unstable.   Past Medical History:  Diagnosis Date   Anxiety    Depression    Medical history non-contributory    Past Surgical History:  Procedure Laterality Date   ETHMOIDECTOMY Bilateral 08/20/2018   Procedure: ETHMOIDECTOMY;  Surgeon: Lesly Raspberry, MD;  Location: [facility];  Service: ENT;  Laterality: Bilateral;   FRONTAL SINUS EXPLORATION Bilateral 08/20/2018   Procedure: FRONTAL SINUS EXPLORATION;  Surgeon: Lesly Raspberry, MD;  Location: [facility];  Service: ENT;  Laterality: Bilateral;   IMAGE GUIDED SINUS SURGERY N/A 08/20/2018   Procedure: IMAGE GUIDED SINUS SURGERY;  Surgeon: Lesly Raspberry, MD;  Location: [facility];  Service: ENT;  Laterality: N/A;   MAXILLARY ANTROSTOMY Bilateral 08/20/2018   Procedure: MAXILLARY ANTROSTOMY;  Surgeon: Lesly Raspberry, MD;  Location: [facility];  Service: ENT;  Laterality: Bilateral;   MYRINGOTOMY WITH TUBE PLACEMENT Bilateral 08/20/2018   Procedure: MYRINGOTOMY WITH TUBE PLACEMENT;  Surgeon: Lesly Raspberry, MD;  Location: [facility];  Service: ENT;  Laterality: Bilateral;   NASAL ENDOSCOPY WITH EPISTAXIS CONTROL N/A 08/20/2018   Procedure: NASAL ENDOSCOPY WITH EPISTAXIS CONTROL;  Surgeon: Lesly Raspberry, MD;  Location: [facility];  Service: ENT;  Laterality: N/A;   NASAL TURBINATE REDUCTION Bilateral 08/20/2018   Procedure: TURBINATE REDUCTION/SUBMUCOSAL RESECTION;  Surgeon: Lesly Raspberry, MD;  Location: [facility];  Service: ENT;  Laterality:  Bilateral;   WISDOM TOOTH EXTRACTION     Allergies  Allergen Reactions   Bupropion Rash   Sertraline Rash      Physical Exam: General: The patient is alert and oriented x3 in no acute distress.  Dermatology: Skin is warm, dry and supple bilateral lower extremities. Negative for open lesions or macerations.  Vascular: Palpable pedal pulses bilaterally. No edema or erythema noted. Capillary refill within normal limits.  Neurological: Grossly intact via light touch  Musculoskeletal Exam: Tenderness with palpation around the right ankle joint.  Positive anterior drawer sign.  Chronic ankle instability also noted.  Radiographic exam RT ankle 06/12/2023: Normal osseous mineralization.  Tibiotalar joint congruent.  There is some anterior spurring of the talus and tibia.  No acute fracture identified.  MR ANKLE RIGHT WO CONTRAST 06/22/2023 IMPRESSION: Compared to 09/10/2017: 1. Worsened moderate tibiotalar osteoarthritis. This includes full-thickness cartilage loss within the mid to anterior joint. 2. 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 3. Mild posterior tibial insertional tenosynovitis, mildly worsened from prior. 4. Ganglion extending longitudinally along the plantar medial aspect of the calcaneal body, extending from the plantar medial aspect of the calcaneocuboid articulation. The dominant ganglion measures up to approximately 9 x 22 x 10 mm. 5. Mild-to-moderate plantar calcaneal heel spur with mild-to-moderate associated marrow edema, new from prior. No significant plantar fasciitis.  Assessment: 1.  Chronic right ankle pain secondary to injury 2019 2.  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   -MRI reviewed in detail with the patient. -Currently the patient states that his ankle and heel  is significantly better.  He only has minimal tenderness to the plantar heel.  He says the ankle has improved  significantly as well with taking the meloxicam  daily. -For now we will continue to pursue conservative management.  Ultimately I do believe patient will need surgery at one point in the future -Continue meloxicam  15 mg daily as needed -No injections today.  Explained that if the patient does have occasional flareups due to activity he can come in for an injection either in the ankle joint or plantar fascia -Continue OTC power step insoles that were dispensed last visit.  He says they help significantly -Recommend ankle brace with increased activity.  Patient has a few ankle braces at home -Continue to refrain from going barefoot.  Recommend recovery slides -Return to clinic as needed  *Works at [employer]     Dot Gazella, DPM [facility]  Dr. Dot Gazella, DPM    [address] [place], [place] [address]                Office [phone]  Fax [phone]

## 2024-03-07 ENCOUNTER — Other Ambulatory Visit: Payer: Self-pay | Admitting: Podiatry
